# Patient Record
Sex: Female | Born: 1975 | Race: White | Hispanic: No | Marital: Married | Smoking: Never smoker
Health system: Southern US, Community
[De-identification: ages and names within clinical notes are randomized; demographics above are authoritative.]

## PROBLEM LIST (undated history)

## (undated) DIAGNOSIS — Z9889 Other specified postprocedural states: Secondary | ICD-10-CM

## (undated) DIAGNOSIS — I10 Essential (primary) hypertension: Secondary | ICD-10-CM

## (undated) DIAGNOSIS — E119 Type 2 diabetes mellitus without complications: Secondary | ICD-10-CM

## (undated) DIAGNOSIS — E7800 Pure hypercholesterolemia, unspecified: Secondary | ICD-10-CM

## (undated) HISTORY — PX: APPENDECTOMY: SHX54

---

## 2000-08-08 ENCOUNTER — Other Ambulatory Visit: Admission: RE | Admit: 2000-08-08 | Discharge: 2000-08-08 | Payer: Self-pay | Admitting: Obstetrics & Gynecology

## 2001-08-21 ENCOUNTER — Other Ambulatory Visit: Admission: RE | Admit: 2001-08-21 | Discharge: 2001-08-21 | Payer: Self-pay | Admitting: Obstetrics & Gynecology

## 2002-02-06 ENCOUNTER — Other Ambulatory Visit: Admission: RE | Admit: 2002-02-06 | Discharge: 2002-02-06 | Payer: Self-pay | Admitting: Obstetrics & Gynecology

## 2002-09-21 ENCOUNTER — Other Ambulatory Visit: Admission: RE | Admit: 2002-09-21 | Discharge: 2002-09-21 | Payer: Self-pay | Admitting: Obstetrics & Gynecology

## 2003-10-21 ENCOUNTER — Other Ambulatory Visit: Admission: RE | Admit: 2003-10-21 | Discharge: 2003-10-21 | Payer: Self-pay | Admitting: Obstetrics & Gynecology

## 2004-10-14 ENCOUNTER — Other Ambulatory Visit: Admission: RE | Admit: 2004-10-14 | Discharge: 2004-10-14 | Payer: Self-pay | Admitting: Obstetrics and Gynecology

## 2006-02-02 ENCOUNTER — Encounter: Admission: RE | Admit: 2006-02-02 | Discharge: 2006-05-03 | Payer: Self-pay | Admitting: Family Medicine

## 2006-03-09 ENCOUNTER — Encounter: Admission: RE | Admit: 2006-03-09 | Discharge: 2006-06-07 | Payer: Self-pay | Admitting: Family Medicine

## 2006-12-13 ENCOUNTER — Encounter: Admission: RE | Admit: 2006-12-13 | Discharge: 2006-12-29 | Payer: Self-pay | Admitting: Family Medicine

## 2007-02-11 ENCOUNTER — Inpatient Hospital Stay (HOSPITAL_COMMUNITY): Admission: AD | Admit: 2007-02-11 | Discharge: 2007-02-13 | Payer: Self-pay | Admitting: Obstetrics & Gynecology

## 2011-02-25 LAB — CBC
HCT: 36.2
HCT: 38
Hemoglobin: 12.5
Hemoglobin: 13.2
MCV: 86.8
MCV: 87
RBC: 4.36
RDW: 15.4 — ABNORMAL HIGH
WBC: 10.3

## 2011-02-25 LAB — RPR: RPR Ser Ql: NONREACTIVE

## 2015-08-25 ENCOUNTER — Encounter (HOSPITAL_BASED_OUTPATIENT_CLINIC_OR_DEPARTMENT_OTHER): Payer: Self-pay | Admitting: *Deleted

## 2015-08-25 ENCOUNTER — Emergency Department (HOSPITAL_BASED_OUTPATIENT_CLINIC_OR_DEPARTMENT_OTHER)
Admission: EM | Admit: 2015-08-25 | Discharge: 2015-08-25 | Disposition: A | Discharge status: Home / Self Care | Payer: BLUE CROSS/BLUE SHIELD | Attending: Emergency Medicine | Admitting: Emergency Medicine

## 2015-08-25 ENCOUNTER — Emergency Department (HOSPITAL_BASED_OUTPATIENT_CLINIC_OR_DEPARTMENT_OTHER): Payer: BLUE CROSS/BLUE SHIELD

## 2015-08-25 DIAGNOSIS — Z9889 Other specified postprocedural states: Secondary | ICD-10-CM | POA: Insufficient documentation

## 2015-08-25 DIAGNOSIS — E119 Type 2 diabetes mellitus without complications: Secondary | ICD-10-CM | POA: Diagnosis not present

## 2015-08-25 DIAGNOSIS — Z7984 Long term (current) use of oral hypoglycemic drugs: Secondary | ICD-10-CM | POA: Insufficient documentation

## 2015-08-25 DIAGNOSIS — R109 Unspecified abdominal pain: Secondary | ICD-10-CM

## 2015-08-25 DIAGNOSIS — M549 Dorsalgia, unspecified: Secondary | ICD-10-CM

## 2015-08-25 DIAGNOSIS — IMO0001 Reserved for inherently not codable concepts without codable children: Secondary | ICD-10-CM

## 2015-08-25 DIAGNOSIS — K2970 Gastritis, unspecified, without bleeding: Secondary | ICD-10-CM

## 2015-08-25 DIAGNOSIS — I10 Essential (primary) hypertension: Secondary | ICD-10-CM | POA: Diagnosis not present

## 2015-08-25 DIAGNOSIS — M62830 Muscle spasm of back: Secondary | ICD-10-CM | POA: Diagnosis not present

## 2015-08-25 DIAGNOSIS — Z3202 Encounter for pregnancy test, result negative: Secondary | ICD-10-CM | POA: Diagnosis not present

## 2015-08-25 DIAGNOSIS — R1013 Epigastric pain: Secondary | ICD-10-CM | POA: Diagnosis present

## 2015-08-25 HISTORY — DX: Essential (primary) hypertension: I10

## 2015-08-25 HISTORY — DX: Type 2 diabetes mellitus without complications: E11.9

## 2015-08-25 HISTORY — DX: Pure hypercholesterolemia, unspecified: E78.00

## 2015-08-25 LAB — CBC WITH DIFFERENTIAL/PLATELET
BASOS ABS: 0 10*3/uL (ref 0.0–0.1)
BASOS PCT: 1 %
EOS PCT: 2 %
Eosinophils Absolute: 0.1 10*3/uL (ref 0.0–0.7)
HCT: 32.7 % — ABNORMAL LOW (ref 36.0–46.0)
Hemoglobin: 10.6 g/dL — ABNORMAL LOW (ref 12.0–15.0)
LYMPHS PCT: 40 %
Lymphs Abs: 2.5 10*3/uL (ref 0.7–4.0)
MCH: 28 pg (ref 26.0–34.0)
MCHC: 32.4 g/dL (ref 30.0–36.0)
MCV: 86.5 fL (ref 78.0–100.0)
MONO ABS: 0.7 10*3/uL (ref 0.1–1.0)
MONOS PCT: 10 %
NEUTROS ABS: 3 10*3/uL (ref 1.7–7.7)
NEUTROS PCT: 47 %
PLATELETS: 277 10*3/uL (ref 150–400)
RBC: 3.78 MIL/uL — ABNORMAL LOW (ref 3.87–5.11)
RDW: 12.7 % (ref 11.5–15.5)
WBC: 6.4 10*3/uL (ref 4.0–10.5)

## 2015-08-25 LAB — COMPREHENSIVE METABOLIC PANEL
ALT: 28 U/L (ref 14–54)
ANION GAP: 9 (ref 5–15)
AST: 21 U/L (ref 15–41)
Albumin: 3.5 g/dL (ref 3.5–5.0)
Alkaline Phosphatase: 52 U/L (ref 38–126)
BILIRUBIN TOTAL: 0.2 mg/dL — AB (ref 0.3–1.2)
BUN: 16 mg/dL (ref 6–20)
CO2: 24 mmol/L (ref 22–32)
Calcium: 9.1 mg/dL (ref 8.9–10.3)
Chloride: 106 mmol/L (ref 101–111)
Creatinine, Ser: 0.89 mg/dL (ref 0.44–1.00)
GFR calc Af Amer: >60 mL/min (ref >60)
GLUCOSE: 167 mg/dL — AB (ref 65–99)
POTASSIUM: 4.2 mmol/L (ref 3.5–5.1)
Sodium: 139 mmol/L (ref 135–145)
TOTAL PROTEIN: 6.9 g/dL (ref 6.5–8.1)

## 2015-08-25 LAB — URINALYSIS, ROUTINE W REFLEX MICROSCOPIC
Bilirubin Urine: NEGATIVE
Glucose, UA: NEGATIVE mg/dL
Ketones, ur: 15 mg/dL — AB
NITRITE: NEGATIVE
Protein, ur: NEGATIVE mg/dL
SPECIFIC GRAVITY, URINE: 1.025 (ref 1.005–1.030)
pH: 5.5 (ref 5.0–8.0)

## 2015-08-25 LAB — URINE MICROSCOPIC-ADD ON

## 2015-08-25 LAB — TROPONIN I: Troponin I: <0.03 ng/mL (ref <0.031)

## 2015-08-25 LAB — PREGNANCY, URINE: PREG TEST UR: NEGATIVE

## 2015-08-25 LAB — LIPASE, BLOOD: LIPASE: 40 U/L (ref 11–51)

## 2015-08-25 LAB — D-DIMER, QUANTITATIVE (NOT AT ARMC): D DIMER QUANT: 0.58 ug{FEU}/mL — AB (ref 0.00–0.50)

## 2015-08-25 MED ORDER — METHOCARBAMOL 500 MG PO TABS: 500.0000 mg | ORAL_TABLET | Freq: Two times a day (BID) | ORAL | Status: DC

## 2015-08-25 MED ORDER — DICYCLOMINE HCL 20 MG PO TABS: 20.0000 mg | ORAL_TABLET | Freq: Two times a day (BID) | ORAL | Status: DC

## 2015-08-25 MED ORDER — KETOROLAC TROMETHAMINE 30 MG/ML IJ SOLN
30.0000 mg | Freq: Once | INTRAMUSCULAR | Status: AC
  Administered 2015-08-25: 30 mg via INTRAVENOUS
  Filled 2015-08-25: qty 1

## 2015-08-25 MED ORDER — IOPAMIDOL (ISOVUE-370) INJECTION 76%
100.0000 mL | Freq: Once | INTRAVENOUS | Status: AC | PRN
  Administered 2015-08-25: 100 mL via INTRAVENOUS

## 2015-08-25 MED ORDER — OMEPRAZOLE 20 MG PO CPDR: 20.0000 mg | DELAYED_RELEASE_CAPSULE | Freq: Every day | ORAL | Status: DC

## 2015-08-25 MED ORDER — SUCRALFATE 1 GM/10ML PO SUSP: 1.0000 g | Freq: Three times a day (TID) | ORAL | Status: DC

## 2015-08-25 MED ORDER — FENTANYL CITRATE (PF) 100 MCG/2ML IJ SOLN
50.0000 ug | Freq: Once | INTRAMUSCULAR | Status: AC
  Administered 2015-08-25: 50 ug via INTRAVENOUS
  Filled 2015-08-25: qty 2

## 2015-08-25 MED ORDER — FAMOTIDINE 20 MG PO TABS
20.0000 mg | ORAL_TABLET | Freq: Once | ORAL | Status: AC
  Administered 2015-08-25: 20 mg via ORAL
  Filled 2015-08-25: qty 1

## 2015-08-25 MED ORDER — TRAMADOL HCL 50 MG PO TABS: 50.0000 mg | ORAL_TABLET | Freq: Four times a day (QID) | ORAL | Status: DC | PRN

## 2015-08-25 MED ORDER — METHOCARBAMOL 500 MG PO TABS
1000.0000 mg | ORAL_TABLET | Freq: Once | ORAL | Status: AC
  Administered 2015-08-25: 1000 mg via ORAL
  Filled 2015-08-25: qty 2

## 2015-08-25 MED ORDER — GI COCKTAIL ~~LOC~~
30.0000 mL | Freq: Once | ORAL | Status: AC
  Administered 2015-08-25: 30 mL via ORAL
  Filled 2015-08-25: qty 30

## 2015-08-25 MED ORDER — DICYCLOMINE HCL 10 MG/ML IM SOLN
20.0000 mg | Freq: Once | INTRAMUSCULAR | Status: AC
  Administered 2015-08-25: 20 mg via INTRAMUSCULAR
  Filled 2015-08-25: qty 2

## 2015-08-25 NOTE — ED Provider Notes (Signed)
CSN: [id number]     Arrival date & time 08/25/15  0231 History   First MD Initiated Contact with Patient 08/25/15 [phone]     Chief Complaint  Patient presents with  . severe back pain      (Consider location/radiation/quality/duration/timing/severity/associated sxs/prior Treatment) Patient is a 40 y.o. female presenting with abdominal pain. The history is provided by the patient. No language interpreter was used.  Abdominal Pain Pain location:  Epigastric Pain quality: no stiffness and not tearing   Pain radiates to:  Does not radiate (but does have upper left back pain that started when she sat up acutely in bed due to pain in the epigastrum) Pain severity:  Severe Onset quality:  Sudden Timing:  Constant Progression:  Unchanged Chronicity:  New Context: awakening from sleep   Context: not diet changes, not sick contacts and not trauma   Relieved by:  Nothing Worsened by:  Nothing tried Ineffective treatments:  None tried Associated symptoms: belching   Associated symptoms: no chest pain, no chills, no constipation, no cough, no diarrhea, no dysuria, no fever, no hematuria, no shortness of breath and no vomiting   Risk factors: no alcohol abuse     Past Medical History  Diagnosis Date  . Diabetes mellitus without complication (HCC)   . Hypertension   . High cholesterol    Past Surgical History  Procedure Laterality Date  . Appendectomy     No family history on file. Social History  Substance Use Topics  . Smoking status: Never Smoker   . Smokeless tobacco: None  . Alcohol Use: Yes     Comment: very occasional    OB History    No data available     Review of Systems  Constitutional: Negative for fever and chills.  Respiratory: Negative for cough and shortness of breath.   Cardiovascular: Negative for chest pain, palpitations and leg swelling.  Gastrointestinal: Positive for abdominal pain. Negative for vomiting, diarrhea and constipation.  Genitourinary:  Negative for dysuria, hematuria and flank pain.  All other systems reviewed and are negative.     Allergies  Review of patient's allergies indicates no known allergies.  Home Medications   Prior to Admission medications   Medication Sig Start Date End Date Taking? Authorizing Provider  LISINOPRIL-HYDROCHLOROTHIAZIDE PO Take by mouth.   Yes Historical Provider, MD  metformin (FORTAMET) 1000 MG (OSM) 24 hr tablet Take 1,000 mg by mouth 2 (two) times daily with a meal.   Yes Historical Provider, MD   BP 170/94 mmHg  Pulse 97  Temp(Src) 97.6 F (36.4 C) (Oral)  Resp 22  Ht 5\' 4"  (1.626 m)  Wt 195 lb (88.451 kg)  BMI 33.46 kg/m2  SpO2 100%  LMP 08/18/2015 (Approximate) Physical Exam  Constitutional: She is oriented to person, place, and time. She appears well-developed and well-nourished. No distress.  HENT:  Head: Normocephalic and atraumatic.  Mouth/Throat: Oropharynx is clear and moist.  Eyes: Conjunctivae are normal. Pupils are equal, round, and reactive to light.  Neck: Normal range of motion. Neck supple. No thyromegaly present.  Cardiovascular: Normal rate, regular rhythm and intact distal pulses.   Pulmonary/Chest: Effort normal. No accessory muscle usage. No respiratory distress. She has no decreased breath sounds. She has no wheezes. She has no rhonchi. She has no rales.    Abdominal: Soft. She exhibits no mass. There is no tenderness. There is no rigidity, no rebound, no guarding, no tenderness at McBurney's point and negative Murphy's sign.  Extremely gassy in  the upper abdomen, hyperactive in the lower abdomen  Musculoskeletal: Normal range of motion. She exhibits no edema or tenderness.  Lymphadenopathy:    She has no cervical adenopathy.  Neurological: She is alert and oriented to person, place, and time. She has normal reflexes.  Skin: Skin is warm and dry. She is not diaphoretic.  Psychiatric: She has a normal mood and affect.    ED Course  Procedures  (including critical care time) Results for orders placed or performed during the hospital encounter of 08/25/15  Urinalysis, Routine w reflex microscopic (not at [facility])  Result Value Ref Range   Color, Urine YELLOW YELLOW   APPearance CLOUDY (A) CLEAR   Specific Gravity, Urine 1.025 1.005 - 1.030   pH 5.5 5.0 - 8.0   Glucose, UA NEGATIVE NEGATIVE mg/dL   Hgb urine dipstick SMALL (A) NEGATIVE   Bilirubin Urine NEGATIVE NEGATIVE   Ketones, ur 15 (A) NEGATIVE mg/dL   Protein, ur NEGATIVE NEGATIVE mg/dL   Nitrite NEGATIVE NEGATIVE   Leukocytes, UA MODERATE (A) NEGATIVE  Pregnancy, urine  Result Value Ref Range   Preg Test, Ur NEGATIVE NEGATIVE  CBC with Differential/Platelet  Result Value Ref Range   WBC 6.4 4.0 - 10.5 K/uL   RBC 3.78 (L) 3.87 - 5.11 MIL/uL   Hemoglobin 10.6 (L) 12.0 - 15.0 g/dL   HCT 16.1 (L) 09.6 - 04.5 %   MCV 86.5 78.0 - 100.0 fL   MCH 28.0 26.0 - 34.0 pg   MCHC 32.4 30.0 - 36.0 g/dL   RDW 40.9 81.1 - 91.4 %   Platelets 277 150 - 400 K/uL   Neutrophils Relative % 47 %   Neutro Abs 3.0 1.7 - 7.7 K/uL   Lymphocytes Relative 40 %   Lymphs Abs 2.5 0.7 - 4.0 K/uL   Monocytes Relative 10 %   Monocytes Absolute 0.7 0.1 - 1.0 K/uL   Eosinophils Relative 2 %   Eosinophils Absolute 0.1 0.0 - 0.7 K/uL   Basophils Relative 1 %   Basophils Absolute 0.0 0.0 - 0.1 K/uL  Comprehensive metabolic panel  Result Value Ref Range   Sodium 139 135 - 145 mmol/L   Potassium 4.2 3.5 - 5.1 mmol/L   Chloride 106 101 - 111 mmol/L   CO2 24 22 - 32 mmol/L   Glucose, Bld 167 (H) 65 - 99 mg/dL   BUN 16 6 - 20 mg/dL   Creatinine, Ser 7.82 0.44 - 1.00 mg/dL   Calcium 9.1 8.9 - 95.6 mg/dL   Total Protein 6.9 6.5 - 8.1 g/dL   Albumin 3.5 3.5 - 5.0 g/dL   AST 21 15 - 41 U/L   ALT 28 14 - 54 U/L   Alkaline Phosphatase 52 38 - 126 U/L   Total Bilirubin 0.2 (L) 0.3 - 1.2 mg/dL   GFR calc non Af Amer >60 >60 mL/min   GFR calc Af Amer >60 >60 mL/min   Anion gap 9 5 - 15  Lipase, blood    Result Value Ref Range   Lipase 40 11 - 51 U/L  Troponin I  Result Value Ref Range   Troponin I <0.03 <0.031 ng/mL  Urine microscopic-add on  Result Value Ref Range   Squamous Epithelial / LPF 6-30 (A) NONE SEEN   WBC, UA 6-30 0 - 5 WBC/hpf   RBC / HPF 0-5 0 - 5 RBC/hpf   Bacteria, UA FEW (A) NONE SEEN   No results found.   Imaging Review No results  found. I have personally reviewed and evaluated these images and lab results as part of my medical decision-making.   EKG Interpretation None      Date: 08/25/2015  Rate: 78  Rhythm: normal sinus rhythm  QRS Axis: normal  Intervals: normal  ST/T Wave abnormalities: normal  Conduction Disutrbances: none  Narrative Interpretation: unremarkable    MDM   Final diagnoses:  None    Results for orders placed or performed during the hospital encounter of 08/25/15  Urinalysis, Routine w reflex microscopic (not at [facility])  Result Value Ref Range   Color, Urine YELLOW YELLOW   APPearance CLOUDY (A) CLEAR   Specific Gravity, Urine 1.025 1.005 - 1.030   pH 5.5 5.0 - 8.0   Glucose, UA NEGATIVE NEGATIVE mg/dL   Hgb urine dipstick SMALL (A) NEGATIVE   Bilirubin Urine NEGATIVE NEGATIVE   Ketones, ur 15 (A) NEGATIVE mg/dL   Protein, ur NEGATIVE NEGATIVE mg/dL   Nitrite NEGATIVE NEGATIVE   Leukocytes, UA MODERATE (A) NEGATIVE  Pregnancy, urine  Result Value Ref Range   Preg Test, Ur NEGATIVE NEGATIVE  CBC with Differential/Platelet  Result Value Ref Range   WBC 6.4 4.0 - 10.5 K/uL   RBC 3.78 (L) 3.87 - 5.11 MIL/uL   Hemoglobin 10.6 (L) 12.0 - 15.0 g/dL   HCT 41.6 (L) 60.6 - 30.1 %   MCV 86.5 78.0 - 100.0 fL   MCH 28.0 26.0 - 34.0 pg   MCHC 32.4 30.0 - 36.0 g/dL   RDW 60.1 09.3 - 23.5 %   Platelets 277 150 - 400 K/uL   Neutrophils Relative % 47 %   Neutro Abs 3.0 1.7 - 7.7 K/uL   Lymphocytes Relative 40 %   Lymphs Abs 2.5 0.7 - 4.0 K/uL   Monocytes Relative 10 %   Monocytes Absolute 0.7 0.1 - 1.0 K/uL   Eosinophils  Relative 2 %   Eosinophils Absolute 0.1 0.0 - 0.7 K/uL   Basophils Relative 1 %   Basophils Absolute 0.0 0.0 - 0.1 K/uL  Comprehensive metabolic panel  Result Value Ref Range   Sodium 139 135 - 145 mmol/L   Potassium 4.2 3.5 - 5.1 mmol/L   Chloride 106 101 - 111 mmol/L   CO2 24 22 - 32 mmol/L   Glucose, Bld 167 (H) 65 - 99 mg/dL   BUN 16 6 - 20 mg/dL   Creatinine, Ser 5.73 0.44 - 1.00 mg/dL   Calcium 9.1 8.9 - 22.0 mg/dL   Total Protein 6.9 6.5 - 8.1 g/dL   Albumin 3.5 3.5 - 5.0 g/dL   AST 21 15 - 41 U/L   ALT 28 14 - 54 U/L   Alkaline Phosphatase 52 38 - 126 U/L   Total Bilirubin 0.2 (L) 0.3 - 1.2 mg/dL   GFR calc non Af Amer >60 >60 mL/min   GFR calc Af Amer >60 >60 mL/min   Anion gap 9 5 - 15  Lipase, blood  Result Value Ref Range   Lipase 40 11 - 51 U/L  Troponin I  Result Value Ref Range   Troponin I <0.03 <0.031 ng/mL  D-dimer, quantitative (not at [facility])  Result Value Ref Range   D-Dimer, Quant 0.58 (H) 0.00 - 0.50 ug/mL-FEU  Urine microscopic-add on  Result Value Ref Range   Squamous Epithelial / LPF 6-30 (A) NONE SEEN   WBC, UA 6-30 0 - 5 WBC/hpf   RBC / HPF 0-5 0 - 5 RBC/hpf   Bacteria, UA FEW (A)  NONE SEEN   Ct Angio Chest/abd/pel For Dissection W And/or W/wo  08/25/2015  CLINICAL DATA:  Awakened at 01:15 by severe pain in her upper back. Vomiting. Upper abdominal pain, diaphoresis and dyspnea as well. EXAM: CT ANGIOGRAPHY CHEST, ABDOMEN AND PELVIS TECHNIQUE: Multidetector CT imaging through the chest, abdomen and pelvis was performed using the standard protocol during bolus administration of intravenous contrast. Multiplanar reconstructed images and MIPs were obtained and reviewed to evaluate the vascular anatomy. CONTRAST:  100 mL Isovue 370 intravenous COMPARISON:  None. FINDINGS: CTA CHEST FINDINGS The thoracic aorta is normal in caliber and intact. There is no dissection. There is no aneurysm. Great vessels are widely patent proximally. Review of the MIP images  confirms the above findings. Nonvascular findings in the chest: No adenopathy. No effusions. The airways are patent. The lungs are clear. No significant skeletal lesion. CTA ABDOMEN AND PELVIS FINDINGS The abdominal aorta is normal in caliber. The celiac, SMA and IMA arteries are widely patent. Both single renal arteries are widely patent. Iliac arteries are widely patent. Review of the MIP images confirms the above findings. Nonvascular findings in the abdomen and pelvis: The liver, gallbladder and bile ducts are unremarkable. The pancreas, spleen, adrenals and kidneys appear normal. Ureters and urinary bladder are unremarkable. Probable uterine fibroids. No adnexal abnormalities. Bowel is unremarkable. No acute inflammatory changes are evident in the abdomen or pelvis. There is no adenopathy. There is no ascites. No significant skeletal lesion is evident. IMPRESSION: 1. The thoracic and abdominal aorta is normal in caliber and intact. No dissection or other acute vascular event. 2. No significant abnormality in the chest. 3. Probable uterine fibroids. Otherwise unremarkable abdomen and pelvis. Electronically Signed   By: Ellery Plunkaniel R Mitchell M.D.   On: 08/25/2015 05:46   I suspect this is gastritis with really bad gas as patient had really bad gas on exam and felt some better post GI cocktail.  She has been ruled out for MI, dissection had normal appendix and internal organs there is not exam or lab finding consistent with gall bladder disease and the gall bladder was read as unremarkable on CT.  PERC negative wells 0 highly doubt PE.  Ruled out for MI with normal EKG and 2 normal troponins. No dissection.   Exam is consistent with gastritis likely GERd/gas related to eating chips and ribs before going to bed.  Back pain is a spasm likely from the way patient sat bolt upright in bed due to the pain.  Will treat symptomatically robaxin.  Recheck at your doctor in 48 hours.  Strict abdominal pain return precautions  given to the patient.  Verbalizes understanding and agrees to follow up   An After Visit Summary was printed and given to the patient.   Medication List    TAKE these medications        dicyclomine 20 MG tablet  Commonly known as:  BENTYL  Take 1 tablet (20 mg total) by mouth 2 (two) times daily.     methocarbamol 500 MG tablet  Commonly known as:  ROBAXIN  Take 1 tablet (500 mg total) by mouth 2 (two) times daily.     omeprazole 20 MG capsule  Commonly known as:  PRILOSEC  Take 1 capsule (20 mg total) by mouth daily.     sucralfate 1 GM/10ML suspension  Commonly known as:  CARAFATE  Take 10 mLs (1 g total) by mouth 4 (four) times daily -  with meals and at bedtime.  traMADol 50 MG tablet  Commonly known as:  ULTRAM  Take 1 tablet (50 mg total) by mouth every 6 (six) hours as needed.      ASK your doctor about these medications        LISINOPRIL-HYDROCHLOROTHIAZIDE PO  Take by mouth.     metformin 1000 MG (OSM) 24 hr tablet  Commonly known as:  FORTAMET  Take 1,000 mg by mouth 2 (two) times daily with a meal.         Kiet Geer, MD 08/25/15 0725

## 2015-08-25 NOTE — ED Notes (Signed)
Returned from CT.

## 2015-08-25 NOTE — ED Notes (Addendum)
Pt states she woke up at 1:15am to severe pain in her upper back. States pain is constant and feels tight. States she has had several bowel movements. States she vomited times one. States she is also having upper abd pain bilateral. C/o feeling a little sob and diaphoretic. C/o nausea. Denies any issues with her gallbladder. States she has been sick a few weeks ago and was on a couple antibiotics. States she has been fatigued since then.

## 2015-08-25 NOTE — ED Notes (Signed)
Pt in CT.

## 2015-08-25 NOTE — ED Notes (Signed)
Ginger ale given per pt request. 

## 2015-08-25 NOTE — ED Notes (Signed)
MD with pt  

## 2015-08-26 ENCOUNTER — Emergency Department (HOSPITAL_COMMUNITY): Payer: BLUE CROSS/BLUE SHIELD

## 2015-08-26 ENCOUNTER — Encounter (HOSPITAL_COMMUNITY): Payer: Self-pay | Admitting: *Deleted

## 2015-08-26 ENCOUNTER — Emergency Department (HOSPITAL_COMMUNITY)
Admission: EM | Admit: 2015-08-26 | Discharge: 2015-08-26 | Disposition: A | Discharge status: Home / Self Care | Payer: BLUE CROSS/BLUE SHIELD | Attending: Emergency Medicine | Admitting: Emergency Medicine

## 2015-08-26 DIAGNOSIS — R109 Unspecified abdominal pain: Secondary | ICD-10-CM | POA: Diagnosis present

## 2015-08-26 DIAGNOSIS — Z3202 Encounter for pregnancy test, result negative: Secondary | ICD-10-CM | POA: Insufficient documentation

## 2015-08-26 DIAGNOSIS — R630 Anorexia: Secondary | ICD-10-CM | POA: Diagnosis not present

## 2015-08-26 DIAGNOSIS — Z7984 Long term (current) use of oral hypoglycemic drugs: Secondary | ICD-10-CM | POA: Diagnosis not present

## 2015-08-26 DIAGNOSIS — D649 Anemia, unspecified: Secondary | ICD-10-CM | POA: Diagnosis not present

## 2015-08-26 DIAGNOSIS — Z79899 Other long term (current) drug therapy: Secondary | ICD-10-CM | POA: Diagnosis not present

## 2015-08-26 DIAGNOSIS — R51 Headache: Secondary | ICD-10-CM | POA: Insufficient documentation

## 2015-08-26 DIAGNOSIS — R110 Nausea: Secondary | ICD-10-CM | POA: Insufficient documentation

## 2015-08-26 DIAGNOSIS — E119 Type 2 diabetes mellitus without complications: Secondary | ICD-10-CM | POA: Diagnosis not present

## 2015-08-26 DIAGNOSIS — R1010 Upper abdominal pain, unspecified: Secondary | ICD-10-CM

## 2015-08-26 DIAGNOSIS — R1011 Right upper quadrant pain: Secondary | ICD-10-CM | POA: Diagnosis not present

## 2015-08-26 DIAGNOSIS — I10 Essential (primary) hypertension: Secondary | ICD-10-CM | POA: Diagnosis not present

## 2015-08-26 LAB — I-STAT BETA HCG BLOOD, ED (MC, WL, AP ONLY): I-stat hCG, quantitative: <5 m[IU]/mL (ref <5)

## 2015-08-26 LAB — CBC
HEMATOCRIT: 31.3 % — AB (ref 36.0–46.0)
HEMOGLOBIN: 10.5 g/dL — AB (ref 12.0–15.0)
MCH: 28.3 pg (ref 26.0–34.0)
MCHC: 33.5 g/dL (ref 30.0–36.0)
MCV: 84.4 fL (ref 78.0–100.0)
Platelets: 309 10*3/uL (ref 150–400)
RBC: 3.71 MIL/uL — ABNORMAL LOW (ref 3.87–5.11)
RDW: 13 % (ref 11.5–15.5)
WBC: 6.6 10*3/uL (ref 4.0–10.5)

## 2015-08-26 LAB — COMPREHENSIVE METABOLIC PANEL
ALBUMIN: 3.3 g/dL — AB (ref 3.5–5.0)
ALT: 27 U/L (ref 14–54)
ANION GAP: 12 (ref 5–15)
AST: 19 U/L (ref 15–41)
Alkaline Phosphatase: 40 U/L (ref 38–126)
BILIRUBIN TOTAL: 0.3 mg/dL (ref 0.3–1.2)
BUN: 11 mg/dL (ref 6–20)
CO2: 21 mmol/L — AB (ref 22–32)
Calcium: 9.2 mg/dL (ref 8.9–10.3)
Chloride: 108 mmol/L (ref 101–111)
Creatinine, Ser: 0.8 mg/dL (ref 0.44–1.00)
GFR calc Af Amer: >60 mL/min (ref >60)
GFR calc non Af Amer: >60 mL/min (ref >60)
GLUCOSE: 152 mg/dL — AB (ref 65–99)
POTASSIUM: 4.2 mmol/L (ref 3.5–5.1)
SODIUM: 141 mmol/L (ref 135–145)
TOTAL PROTEIN: 6.6 g/dL (ref 6.5–8.1)

## 2015-08-26 LAB — LIPASE, BLOOD: Lipase: 48 U/L (ref 11–51)

## 2015-08-26 LAB — POC OCCULT BLOOD, ED: Fecal Occult Bld: NEGATIVE

## 2015-08-26 MED ORDER — ACETAMINOPHEN 325 MG PO TABS
650.0000 mg | ORAL_TABLET | Freq: Once | ORAL | Status: AC
  Administered 2015-08-26: 650 mg via ORAL
  Filled 2015-08-26: qty 2

## 2015-08-26 MED ORDER — PANTOPRAZOLE SODIUM 40 MG IV SOLR
40.0000 mg | Freq: Once | INTRAVENOUS | Status: AC
  Administered 2015-08-26: 40 mg via INTRAVENOUS
  Filled 2015-08-26: qty 40

## 2015-08-26 NOTE — ED Notes (Signed)
Patient transported to Ultrasound 

## 2015-08-26 NOTE — ED Notes (Signed)
Phlebotomy at the bedside  

## 2015-08-26 NOTE — ED Notes (Signed)
Pt c/o bil upper abd pain onset x 2 days, pt seen at [facility] x 2 days for mid & upper back pain with bil upper abd pain, pt told it was related to gas, pt denies relief of pain at this time, pt denies n/v/d, A&O x4

## 2015-08-26 NOTE — ED Notes (Signed)
PA at the bedside.

## 2015-08-26 NOTE — ED Provider Notes (Signed)
CSN: [id number]     Arrival date & time 08/26/15  1015 History   First MD Initiated Contact with Patient 08/26/15 1227     Chief Complaint  Patient presents with  . Abdominal Pain     (Consider location/radiation/quality/duration/timing/severity/associated sxs/prior Treatment) HPI Comments: Patient is a 40 y/o female who presents to the ED with abdominal pain that is intermittent, described as contractions, wakes her at night, occurs daily around each meal time with or without food and in the middle of the night, does not radiate, has been occuring over the past few weeks but has gotten worse to where it brought her into the ED yesterday. She states she has mild bloating and belching with food. Food doesn't affect the pain. She has not eaten anything since last evening. She was given a GI cocktail in the ED yesterday which helped a little but the pain returned. Patient states she was sick roughly 3 weeks ago with a possible sinus infection and was given a Z pack which helped. Husband states she has been sleeping more than normal since she was sick. She denies frequent NSAID use and states she uses ETOH only occasionally.  ED course: Patient states she is not in pain at the present time and does not need anything for her abdominal pain current. She has asked for something for her headache.  Patient is a 40 y.o. female presenting with abdominal pain. The history is provided by the patient and the spouse.  Abdominal Pain Associated symptoms: nausea   Associated symptoms: no chest pain, no chills, no constipation, no cough, no diarrhea, no dysuria, no fever, no shortness of breath, no sore throat and no vomiting     Past Medical History  Diagnosis Date  . Diabetes mellitus without complication (HCC)   . Hypertension   . High cholesterol    Past Surgical History  Procedure Laterality Date  . Appendectomy     No family history on file. Social History  Substance Use Topics  . Smoking  status: Never Smoker   . Smokeless tobacco: None  . Alcohol Use: Yes     Comment: very occasional    OB History    No data available     Review of Systems  Constitutional: Negative for fever, chills and appetite change.  HENT: Negative for ear pain, hearing loss and sore throat.   Eyes: Negative for visual disturbance.  Respiratory: Negative for cough, chest tightness and shortness of breath.   Cardiovascular: Negative for chest pain, palpitations and leg swelling.  Gastrointestinal: Positive for nausea and abdominal pain. Negative for vomiting, diarrhea, constipation and blood in stool.       Positive for dry heaves   Genitourinary: Negative for dysuria and difficulty urinating.       Positive for "strong" smelling urine  Musculoskeletal: Negative for myalgias, joint swelling and arthralgias.  Skin: Negative for rash.  Neurological: Positive for headaches. Negative for dizziness, syncope and light-headedness.  Psychiatric/Behavioral: Negative for confusion and agitation.      Allergies  Review of patient's allergies indicates no known allergies.  Home Medications   Prior to Admission medications   Medication Sig Start Date End Date Taking? Authorizing Provider  dicyclomine (BENTYL) 20 MG tablet Take 1 tablet (20 mg total) by mouth 2 (two) times daily. 08/25/15   April Palumbo, MD  LISINOPRIL-HYDROCHLOROTHIAZIDE PO Take by mouth.    Historical Provider, MD  metformin (FORTAMET) 1000 MG (OSM) 24 hr tablet Take 1,000 mg by mouth 2 (  two) times daily with a meal.    Historical Provider, MD  methocarbamol (ROBAXIN) 500 MG tablet Take 1 tablet (500 mg total) by mouth 2 (two) times daily. 08/25/15   April Palumbo, MD  omeprazole (PRILOSEC) 20 MG capsule Take 1 capsule (20 mg total) by mouth daily. 08/25/15   April Palumbo, MD  sucralfate (CARAFATE) 1 GM/10ML suspension Take 10 mLs (1 g total) by mouth 4 (four) times daily -  with meals and at bedtime. 08/25/15   April Palumbo, MD   traMADol (ULTRAM) 50 MG tablet Take 1 tablet (50 mg total) by mouth every 6 (six) hours as needed. 08/25/15   April Palumbo, MD   BP 154/90 mmHg  Pulse 77  Temp(Src) 98.4 F (36.9 C) (Oral)  Resp 20  Ht 5\' 4"  (1.626 m)  Wt 88.451 kg  BMI 33.46 kg/m2  SpO2 100%  LMP 08/18/2015 Physical Exam  Constitutional: She is oriented to person, place, and time. She appears well-developed and well-nourished. No distress.  HENT:  Head: Normocephalic and atraumatic.  Eyes: Conjunctivae are normal.  Neck: Normal range of motion.  Cardiovascular: Normal rate, regular rhythm and normal heart sounds.   Pulmonary/Chest: Effort normal and breath sounds normal.  Abdominal: Soft. Bowel sounds are normal. She exhibits no distension and no mass. There is no rebound and no guarding.  Patient has mild TTP of the RUQ around the 12th rib  Musculoskeletal: Normal range of motion. She exhibits no edema.  Neurological: She is alert and oriented to person, place, and time. Coordination normal.  Skin: Skin is warm and dry. No rash noted.  Psychiatric: She has a normal mood and affect. Her behavior is normal.    ED Course  Procedures (including critical care time) Labs Review Labs Reviewed  COMPREHENSIVE METABOLIC PANEL - Abnormal; Notable for the following:    CO2 21 (*)    Glucose, Bld 152 (*)    Albumin 3.3 (*)    All other components within normal limits  CBC - Abnormal; Notable for the following:    RBC 3.71 (*)    Hemoglobin 10.5 (*)    HCT 31.3 (*)    All other components within normal limits  LIPASE, BLOOD  URINALYSIS, ROUTINE W REFLEX MICROSCOPIC (NOT AT [facility])  I-STAT BETA HCG BLOOD, ED ([facility], [facility], [facility] ONLY)  POC OCCULT BLOOD, ED    Imaging Review Ct Angio Chest/abd/pel For Dissection W And/or W/wo  08/25/2015  CLINICAL DATA:  Awakened at 01:15 by severe pain in her upper back. Vomiting. Upper abdominal pain, diaphoresis and dyspnea as well. EXAM: CT ANGIOGRAPHY CHEST, ABDOMEN AND PELVIS  TECHNIQUE: Multidetector CT imaging through the chest, abdomen and pelvis was performed using the standard protocol during bolus administration of intravenous contrast. Multiplanar reconstructed images and MIPs were obtained and reviewed to evaluate the vascular anatomy. CONTRAST:  100 mL Isovue 370 intravenous COMPARISON:  None. FINDINGS: CTA CHEST FINDINGS The thoracic aorta is normal in caliber and intact. There is no dissection. There is no aneurysm. Great vessels are widely patent proximally. Review of the MIP images confirms the above findings. Nonvascular findings in the chest: No adenopathy. No effusions. The airways are patent. The lungs are clear. No significant skeletal lesion. CTA ABDOMEN AND PELVIS FINDINGS The abdominal aorta is normal in caliber. The celiac, SMA and IMA arteries are widely patent. Both single renal arteries are widely patent. Iliac arteries are widely patent. Review of the MIP images confirms the above findings. Nonvascular findings in the abdomen  and pelvis: The liver, gallbladder and bile ducts are unremarkable. The pancreas, spleen, adrenals and kidneys appear normal. Ureters and urinary bladder are unremarkable. Probable uterine fibroids. No adnexal abnormalities. Bowel is unremarkable. No acute inflammatory changes are evident in the abdomen or pelvis. There is no adenopathy. There is no ascites. No significant skeletal lesion is evident. IMPRESSION: 1. The thoracic and abdominal aorta is normal in caliber and intact. No dissection or other acute vascular event. 2. No significant abnormality in the chest. 3. Probable uterine fibroids. Otherwise unremarkable abdomen and pelvis. Electronically Signed   By: Ellery Plunk M.D.   On: 08/25/2015 05:46   I have personally reviewed and evaluated these images and lab results as part of my medical decision-making.   EKG Interpretation None      MDM   Final diagnoses:  None    Amy Sanders is a 40 y/o with abdominal  pain that has gotten worse over the past 2 days. She was in the ED in [facility] yesterday in which numerous labs and imaging studies were done. I reviewed the results of her CT yesterday which revealed no acute findings. Cardiac causes of her pain were evaluated yesterday and with no acute findings. Her lipase was 48, she was found to be anemic with a HGB of 10.5. Based on her history of pain and her mild anemia we collected a fecal occult which was negative.   We are suspecting peptic or duodenal ulcer disease, gastritis, or bilary colic. We got a US done today which revealed no acute findings suggestive of acute cholecystitis or dochocholecytisis.   This pain could be related to a dysfunctional gallbladder or peptic ulcer disease. Without the ability to order a HIDA scan or a EGD from the ED we are going to try to set up an appointment for her for tomorrow with GI.   1539: patient states she has had a few episodes of pain while in the ED, one during the [place] but she is currently not in pain and resting comfortably. We instructed her to take the medications she received in the ED yesterday. Dr. Hulen Shouts office will call her to give her a time to be seen on Thursday 4/13 and they are trying to set up a HIDA scan for tomorrow but no guarentee but they will also call her to let her know if they are able to.  I discussed all of the results with the patient and family members they have expressed their understanding to the verbal discharge instructions.         Jerre Simon, PA 08/26/15 1611  Jerelyn Scott, MD 08/27/15 [phone]

## 2015-08-26 NOTE — Discharge Instructions (Signed)
You were seen today for abdominal pain.   Dr. Hulen Shoutsutlaw's office will call you for an appointment for Thursday 4/13. They will also call you to let you know if they are able to schedule a HIDA scan for tomorrow.   Return to the ED if you have inretractable pain, fever, chills, nausea, vomiting, diarrhea, blood in your vomit or stools.   Abdominal Pain, Adult Many things can cause abdominal pain. Usually, abdominal pain is not caused by a disease and will improve without treatment. It can often be observed and treated at home. Your health care provider will do a physical exam and possibly order blood tests and X-rays to help determine the seriousness of your pain. However, in many cases, more time must pass before a clear cause of the pain can be found. Before that point, your health care provider may not know if you need more testing or further treatment. HOME CARE INSTRUCTIONS Monitor your abdominal pain for any changes. The following actions may help to alleviate any discomfort you are experiencing:  Only take over-the-counter or prescription medicines as directed by your health care provider.  Do not take laxatives unless directed to do so by your health care provider.  Try a clear liquid diet (broth, tea, or water) as directed by your health care provider. Slowly move to a bland diet as tolerated. SEEK MEDICAL CARE IF:  You have unexplained abdominal pain.  You have abdominal pain associated with nausea or diarrhea.  You have pain when you urinate or have a bowel movement.  You experience abdominal pain that wakes you in the night.  You have abdominal pain that is worsened or improved by eating food.  You have abdominal pain that is worsened with eating fatty foods.  You have a fever. SEEK IMMEDIATE MEDICAL CARE IF:  Your pain does not go away within 2 hours.  You keep throwing up (vomiting).  Your pain is felt only in portions of the abdomen, such as the right side or the left  lower portion of the abdomen.  You pass bloody or black tarry stools. MAKE SURE YOU:  Understand these instructions.  Will watch your condition.  Will get help right away if you are not doing well or get worse.   This information is not intended to replace advice given to you by your health care provider. Make sure you discuss any questions you have with your health care provider.   Document Released: 02/10/2005 Document Revised: 01/22/2015 Document Reviewed: 01/10/2013 Elsevier Interactive Patient Education [employer]2016 Elsevier Inc.

## 2015-08-26 NOTE — ED Provider Notes (Signed)
S: Amy Sanders is a 40 y.o. female presents to the ED with worsening intermittent right upper quadrant epigastric abdominal pain. Record review shows that patient was seen at [facility] 1 day ago for the same. At that time she had normal labs, negative CT angiogram of the chest and abdomen. She was discharged home with symptomatic therapy. Patient reports it has not significantly helped and she has only taken some of it. She reports the pain is spasmodic and feels like contractions in the right upper quadrant. Of note her gallbladder was normal on the CT scan however no ultrasound was done. Patient was also found to be anemic at that visit.  O:  General: Awake  HEENT: Atraumatic  Resp: Normal effort  Cardiac: RRR Abd: Nondistended, soft, nontender to palpation  Neuro:No focal weakness   Results for orders placed or performed during the hospital encounter of 08/26/15  Lipase, blood  Result Value Ref Range   Lipase 48 11 - 51 U/L  Comprehensive metabolic panel  Result Value Ref Range   Sodium 141 135 - 145 mmol/L   Potassium 4.2 3.5 - 5.1 mmol/L   Chloride 108 101 - 111 mmol/L   CO2 21 (L) 22 - 32 mmol/L   Glucose, Bld 152 (H) 65 - 99 mg/dL   BUN 11 6 - 20 mg/dL   Creatinine, Ser 0.45 0.44 - 1.00 mg/dL   Calcium 9.2 8.9 - 40.9 mg/dL   Total Protein 6.6 6.5 - 8.1 g/dL   Albumin 3.3 (L) 3.5 - 5.0 g/dL   AST 19 15 - 41 U/L   ALT 27 14 - 54 U/L   Alkaline Phosphatase 40 38 - 126 U/L   Total Bilirubin 0.3 0.3 - 1.2 mg/dL   GFR calc non Af Amer >60 >60 mL/min   GFR calc Af Amer >60 >60 mL/min   Anion gap 12 5 - 15  CBC  Result Value Ref Range   WBC 6.6 4.0 - 10.5 K/uL   RBC 3.71 (L) 3.87 - 5.11 MIL/uL   Hemoglobin 10.5 (L) 12.0 - 15.0 g/dL   HCT 81.1 (L) 91.4 - 78.2 %   MCV 84.4 78.0 - 100.0 fL   MCH 28.3 26.0 - 34.0 pg   MCHC 33.5 30.0 - 36.0 g/dL   RDW 95.6 21.3 - 08.6 %   Platelets 309 150 - 400 K/uL  I-Stat Beta hCG blood, ED ([facility], [facility], [facility] only)  Result Value Ref  Range   I-stat hCG, quantitative <5.0 <5 mIU/mL   Comment 3          POC occult blood, ED Provider will collect  Result Value Ref Range   Fecal Occult Bld NEGATIVE NEGATIVE   US Abdomen Complete  08/26/2015  CLINICAL DATA:  Right upper quadrant pain. EXAM: ABDOMEN ULTRASOUND COMPLETE COMPARISON:  None. FINDINGS: Gallbladder: No gallstones or wall thickening visualized. No sonographic Murphy sign noted by sonographer. Common bile duct: Diameter: 2.7 mm Liver: No focal lesion identified. Within normal limits in parenchymal echogenicity. IVC: No abnormality visualized. Pancreas: Visualized portion unremarkable. Spleen: Size and appearance within normal limits. Right Kidney: Length: 10.2 cm. Echogenicity within normal limits. No mass or hydronephrosis visualized. Left Kidney: Length: 11.1 cm. Echogenicity within normal limits. No mass or hydronephrosis visualized. Abdominal aorta: No aneurysm visualized. Other findings: None. IMPRESSION: Normal abdominal ultrasound. Electronically Signed   By: Elige Ko   On: 08/26/2015 15:00   Ct Angio Chest/abd/pel For Dissection W And/or W/wo 08/25/2015  IMPRESSION: 1. The thoracic and abdominal aorta is normal in caliber and intact. No dissection or other acute vascular event. 2. No significant abnormality in the chest. 3. Probable uterine fibroids. Otherwise unremarkable abdomen and pelvis. Electronically Signed   By: Ellery Plunkaniel R Mitchell M.D.   On: 08/25/2015 05:46     A/P:  Pt with right upper quadrant abdominal pain. Labs remain reassuring. Ultrasound shows normal gallbladder without gallstones or wall thickening. Concern for potential gallbladder dysfunction versus peptic ulcer disease.  Patient follow-up was arranged with Dr. Dulce Sellarutlaw, who will plan for HIDA scan and will reevaluate her on Thursday. Encouraged patient to take her medications as directed including her pain medication as needed. So discussed conservative diet choices.  Patient is well-appearing  and her pain is under control at this time.  BP 154/90 mmHg  Pulse 77  Temp(Src) 98.4 F (36.9 C) (Oral)  Resp 20  Ht 5\' 4"  (1.626 m)  Wt 88.451 kg  BMI 33.46 kg/m2  SpO2 100%  LMP 08/18/2015   Pt was seen by Mattie MarlinJessica Focht, PA-C and personally evaluated by myself and discussed with Jerelyn ScottMartha Linker, MD.      Dierdre ForthHannah Stephens Shreve, PA-C 08/26/15 1553  Jerelyn ScottMartha Linker, MD 08/26/15 1554

## 2015-08-27 ENCOUNTER — Encounter (HOSPITAL_COMMUNITY): Admission: RE | Admit: 2015-08-27 | Payer: BLUE CROSS/BLUE SHIELD | Source: Ambulatory Visit

## 2015-08-27 ENCOUNTER — Encounter (HOSPITAL_COMMUNITY): Payer: BLUE CROSS/BLUE SHIELD

## 2015-08-27 ENCOUNTER — Other Ambulatory Visit (HOSPITAL_COMMUNITY): Payer: Self-pay | Admitting: Gastroenterology

## 2015-08-27 ENCOUNTER — Ambulatory Visit (HOSPITAL_COMMUNITY)
Admission: RE | Admit: 2015-08-27 | Discharge: 2015-08-27 | Disposition: A | Discharge status: Home / Self Care | Payer: BLUE CROSS/BLUE SHIELD | Source: Ambulatory Visit | Attending: Gastroenterology | Admitting: Gastroenterology

## 2015-08-27 DIAGNOSIS — R1011 Right upper quadrant pain: Secondary | ICD-10-CM

## 2015-08-27 DIAGNOSIS — K828 Other specified diseases of gallbladder: Secondary | ICD-10-CM | POA: Diagnosis not present

## 2015-08-27 MED ORDER — TECHNETIUM TC 99M MEBROFENIN IV KIT
5.2000 | PACK | Freq: Once | INTRAVENOUS | Status: AC | PRN
  Administered 2015-08-27: 5.2 via INTRAVENOUS

## 2015-09-17 ENCOUNTER — Other Ambulatory Visit: Payer: Self-pay | Admitting: Surgery

## 2016-03-03 ENCOUNTER — Other Ambulatory Visit: Payer: Self-pay | Admitting: Surgery

## 2016-03-25 ENCOUNTER — Encounter (HOSPITAL_BASED_OUTPATIENT_CLINIC_OR_DEPARTMENT_OTHER): Payer: Self-pay | Admitting: *Deleted

## 2016-03-26 ENCOUNTER — Encounter (HOSPITAL_BASED_OUTPATIENT_CLINIC_OR_DEPARTMENT_OTHER)
Admission: RE | Admit: 2016-03-26 | Discharge: 2016-03-26 | Disposition: A | Discharge status: Home / Self Care | Payer: BLUE CROSS/BLUE SHIELD | Source: Ambulatory Visit | Attending: Surgery | Admitting: Surgery

## 2016-03-26 DIAGNOSIS — E7800 Pure hypercholesterolemia, unspecified: Secondary | ICD-10-CM | POA: Insufficient documentation

## 2016-03-26 DIAGNOSIS — Z01812 Encounter for preprocedural laboratory examination: Secondary | ICD-10-CM | POA: Insufficient documentation

## 2016-03-26 DIAGNOSIS — E119 Type 2 diabetes mellitus without complications: Secondary | ICD-10-CM | POA: Insufficient documentation

## 2016-03-26 DIAGNOSIS — I10 Essential (primary) hypertension: Secondary | ICD-10-CM | POA: Diagnosis not present

## 2016-03-26 LAB — BASIC METABOLIC PANEL
ANION GAP: 10 (ref 5–15)
BUN: 14 mg/dL (ref 6–20)
CALCIUM: 9.4 mg/dL (ref 8.9–10.3)
CO2: 24 mmol/L (ref 22–32)
Chloride: 102 mmol/L (ref 101–111)
Creatinine, Ser: 1.02 mg/dL — ABNORMAL HIGH (ref 0.44–1.00)
GFR calc non Af Amer: >60 mL/min (ref >60)
Glucose, Bld: 200 mg/dL — ABNORMAL HIGH (ref 65–99)
POTASSIUM: 5.6 mmol/L — AB (ref 3.5–5.1)
Sodium: 136 mmol/L (ref 135–145)

## 2016-03-31 NOTE — H&P (Signed)
Amy Sanders  [facility] Patient #: [id number] DOB: 07/11/1975 Married / Language: English / Race: White Female   History of Present Illness (Amy Groner A. Magnus IvanBlackman MD;  Patient words: reck.  The patient is a 40 year old female who presents with abdominal pain. This is a pleasant female referred by Dr. Willis ModenaWilliam Outlaw for evaluation of rectal quadrant abdominal pain. She has had several small attacks but then recently had one major attack of right upper quadrant abdominal pain with nausea but no vomiting. This occurred after a fatty meal. It lasted several hours. She has been on an antacid medication without any relief. She has had an ultrasound which is unremarkable and then had a HIDA scan showing her to have a 20% gallbladder ejection fraction. Symptoms were reproduced with Ensure. She is currently pain-free today. Bowel movements within normal. She is otherwise healthy without complaints. When she had the attack, the pain was sharp and moderate to severe in intensity. It did not refer anywhere else   Other Problems [name]([name], CMA;  Back Pain Diabetes Mellitus High blood pressure Hypercholesterolemia Migraine Headache  Past Surgical History [name]([name], CMA Appendectomy  Diagnostic Studies History [name]([name], CMA;  Colonoscopy never Mammogram 1-3 years ago Pap Smear 1-5 years ago  Allergies Lamar Laundry([name], CMA No Known Drug Allergies05/07/2015  Medication History ([name], CMA;   Lisinopril-Hydrochlorothiazide (20-12.5MG  Tablet, Oral) Active. MetFORMIN HCl ER (MOD) (1000MG  Tablet ER 24HR, Oral) Active. Omeprazole (20MG  Capsule DR, Oral) Active. Atorvastatin Calcium (20MG  Tablet, Oral) Active. Low-Ogestrel (0.3-30MG -MCG Tablet, Oral) Active. Methocarbamol (500MG  Tablet, Oral) Active. TraMADol HCl (50MG  Tablet, Oral as needed) Active. Medications Reconciled  Social History  Alcohol use Occasional alcohol  use. Caffeine use Coffee, Tea. No drug use Tobacco use Never smoker.  Family History [name]([name], CMA;  Arthritis Family Members In General, Mother. Diabetes Mellitus Family Members In General, Father, Mother. Hypertension Family Members In General, Father. Malignant Neoplasm Of Pancreas Family Members In General.  Pregnancy / Birth History [name]([name], CMA Age at menarche 10 years. Contraceptive History Oral contraceptives. Gravida 3 Irregular periods Maternal age 42-25 Para 3    Review of Systems Lamar Laundry([name] CMA;  General Present- Appetite Loss, Fatigue, Night Sweats and Weight Loss. Not Present- Chills, Fever and Weight Gain. Skin Not Present- Change in Wart/Mole, Dryness, Hives, Jaundice, New Lesions, Non-Healing Wounds, Rash and Ulcer. HEENT Present- Ringing in the Ears, Seasonal Allergies, Sinus Pain and Wears glasses/contact lenses. Not Present- Earache, Hearing Loss, Hoarseness, Nose Bleed, Oral Ulcers, Sore Throat, Visual Disturbances and Yellow Eyes. Respiratory Not Present- Bloody sputum, Chronic Cough, Difficulty Breathing, Snoring and Wheezing. Breast Not Present- Breast Mass, Breast Pain, Nipple Discharge and Skin Changes. Cardiovascular Not Present- Chest Pain, Difficulty Breathing Lying Down, Leg Cramps, Palpitations, Rapid Heart Rate, Shortness of Breath and Swelling of Extremities. Gastrointestinal Present- Change in Bowel Habits, Excessive gas and Nausea. Not Present- Abdominal Pain, Bloating, Bloody Stool, Chronic diarrhea, Constipation, Difficulty Swallowing, Gets full quickly at meals, Hemorrhoids, Indigestion, Rectal Pain and Vomiting. Female Genitourinary Present- Frequency. Not Present- Nocturia, Painful Urination, Pelvic Pain and Urgency. Musculoskeletal Present- Back Pain and Joint Pain. Not Present- Joint Stiffness, Muscle Pain, Muscle Weakness and Swelling of Extremities. Neurological Present- Headaches. Not Present- Decreased Memory,  Fainting, Numbness, Seizures, Tingling, Tremor, Trouble walking and Weakness. Psychiatric Not Present- Anxiety, Bipolar, Change in Sleep Pattern, Depression, Fearful and Frequent crying. Endocrine Present- Hair Changes. Not Present- Cold Intolerance, Excessive Hunger, Heat Intolerance, Hot flashes and New Diabetes. Hematology Not  Present- Easy Bruising, Excessive bleeding, Gland problems, HIV and Persistent Infections.  Vitals   Weight: 200 lb Height: 64in Body Surface Area: 1.96 m Body Mass Index: 34.33 kg/m  Temp.: 31F(Temporal)  Pulse: 76 (Regular)  BP: 128/80 (Sitting, Left Arm, Standard)    Physical Exam  General Mental Status-Alert. General Appearance-Consistent with stated age. Hydration-Well hydrated. Voice-Normal.  Head and Neck Head-normocephalic, atraumatic with no lesions or palpable masses.  Eye Eyeball - Bilateral-Extraocular movements intact. Sclera/Conjunctiva - Bilateral-No scleral icterus.  Chest and Lung Exam Chest and lung exam reveals -quiet, even and easy respiratory effort with no use of accessory muscles and on auscultation, normal breath sounds, no adventitious sounds and normal vocal resonance. Inspection Chest Wall - Normal. Back - normal.  Cardiovascular Cardiovascular examination reveals -on palpation PMI is normal in location and amplitude, no palpable S3 or S4. Normal cardiac borders., normal heart sounds, regular rate and rhythm with no murmurs, carotid auscultation reveals no bruits and normal pedal pulses bilaterally.  Abdomen Inspection Inspection of the abdomen reveals - No Hernias. Skin - Scar - no surgical scars. Palpation/Percussion Palpation and Percussion of the abdomen reveal - Soft, Non Tender, No Rebound tenderness, No Rigidity (guarding) and No hepatosplenomegaly. Auscultation Auscultation of the abdomen reveals - Bowel sounds normal.  Neurologic Neurologic evaluation reveals -alert and  oriented x 3 with no impairment of recent or remote memory. Mental Status-Normal.  Musculoskeletal Normal Exam - Left-Upper Extremity Strength Normal and Lower Extremity Strength Normal. Normal Exam - Right-Upper Extremity Strength Normal, Lower Extremity Weakness.    Assessment & Plan  BILIARY DYSKINESIA (K82.8)  Impression: I discussed the diagnosis with her in detail. We discussed continued conservative management versus laparoscopic cholecystectomy. I gave her literature regarding the surgery. I discussed the surgical procedure in detail. I discussed the risks which includes but is not limited to bleeding, infection, bile duct injury, bile leak, injury to other structures, the need to convert to an open procedure, postoperative recovery, chances may not resolve her symptoms, etc. She understands and wishes to proceed with surgery Current Plans Pt Education - Pamphlet Given - Laparoscopic Gallbladder Surgery: discussed with patient and provided information.

## 2016-04-01 ENCOUNTER — Encounter (HOSPITAL_BASED_OUTPATIENT_CLINIC_OR_DEPARTMENT_OTHER): Payer: Self-pay | Admitting: Certified Registered"

## 2016-04-01 ENCOUNTER — Ambulatory Visit (HOSPITAL_BASED_OUTPATIENT_CLINIC_OR_DEPARTMENT_OTHER): Payer: BLUE CROSS/BLUE SHIELD | Admitting: Certified Registered"

## 2016-04-01 ENCOUNTER — Encounter (HOSPITAL_BASED_OUTPATIENT_CLINIC_OR_DEPARTMENT_OTHER)
Admission: RE | Disposition: A | Discharge status: Home / Self Care | Payer: Self-pay | Source: Ambulatory Visit | Attending: Surgery

## 2016-04-01 ENCOUNTER — Ambulatory Visit (HOSPITAL_BASED_OUTPATIENT_CLINIC_OR_DEPARTMENT_OTHER)
Admission: RE | Admit: 2016-04-01 | Discharge: 2016-04-01 | Disposition: A | Discharge status: Home / Self Care | Payer: BLUE CROSS/BLUE SHIELD | Source: Ambulatory Visit | Attending: Surgery | Admitting: Surgery

## 2016-04-01 DIAGNOSIS — Z793 Long term (current) use of hormonal contraceptives: Secondary | ICD-10-CM | POA: Diagnosis not present

## 2016-04-01 DIAGNOSIS — E7800 Pure hypercholesterolemia, unspecified: Secondary | ICD-10-CM | POA: Insufficient documentation

## 2016-04-01 DIAGNOSIS — E119 Type 2 diabetes mellitus without complications: Secondary | ICD-10-CM | POA: Diagnosis not present

## 2016-04-01 DIAGNOSIS — K811 Chronic cholecystitis: Secondary | ICD-10-CM | POA: Insufficient documentation

## 2016-04-01 DIAGNOSIS — Z7984 Long term (current) use of oral hypoglycemic drugs: Secondary | ICD-10-CM | POA: Diagnosis not present

## 2016-04-01 DIAGNOSIS — R109 Unspecified abdominal pain: Secondary | ICD-10-CM | POA: Diagnosis present

## 2016-04-01 DIAGNOSIS — Z79899 Other long term (current) drug therapy: Secondary | ICD-10-CM | POA: Diagnosis not present

## 2016-04-01 DIAGNOSIS — I10 Essential (primary) hypertension: Secondary | ICD-10-CM | POA: Diagnosis not present

## 2016-04-01 HISTORY — PX: CHOLECYSTECTOMY: SHX55

## 2016-04-01 LAB — GLUCOSE, CAPILLARY
GLUCOSE-CAPILLARY: 149 mg/dL — AB (ref 65–99)
GLUCOSE-CAPILLARY: 191 mg/dL — AB (ref 65–99)

## 2016-04-01 SURGERY — LAPAROSCOPIC CHOLECYSTECTOMY
Anesthesia: General | Site: Abdomen

## 2016-04-01 MED ORDER — DEXAMETHASONE SODIUM PHOSPHATE 10 MG/ML IJ SOLN
INTRAMUSCULAR | Status: AC
  Filled 2016-04-01: qty 1

## 2016-04-01 MED ORDER — BUPIVACAINE HCL 0.5 % IJ SOLN
INTRAMUSCULAR | Status: DC | PRN
  Administered 2016-04-01: 20 mL

## 2016-04-01 MED ORDER — SUGAMMADEX SODIUM 200 MG/2ML IV SOLN
INTRAVENOUS | Status: DC | PRN
  Administered 2016-04-01: 200 mg via INTRAVENOUS

## 2016-04-01 MED ORDER — KETOROLAC TROMETHAMINE 30 MG/ML IJ SOLN
INTRAMUSCULAR | Status: AC
  Filled 2016-04-01: qty 1

## 2016-04-01 MED ORDER — METOCLOPRAMIDE HCL 5 MG/ML IJ SOLN
10.0000 mg | Freq: Once | INTRAMUSCULAR | Status: AC | PRN
  Administered 2016-04-01: 10 mg via INTRAVENOUS

## 2016-04-01 MED ORDER — OXYCODONE HCL 5 MG PO TABS: 5.0000 mg | ORAL_TABLET | ORAL | Status: DC | PRN

## 2016-04-01 MED ORDER — CHLORHEXIDINE GLUCONATE CLOTH 2 % EX PADS: 6.0000 | MEDICATED_PAD | Freq: Once | CUTANEOUS | Status: DC

## 2016-04-01 MED ORDER — SODIUM CHLORIDE 0.9% FLUSH
3.0000 mL | Freq: Two times a day (BID) | INTRAVENOUS | Status: DC
Start: 2016-04-01 — End: 2016-04-01

## 2016-04-01 MED ORDER — ROCURONIUM BROMIDE 10 MG/ML (PF) SYRINGE
PREFILLED_SYRINGE | INTRAVENOUS | Status: AC
  Filled 2016-04-01: qty 10

## 2016-04-01 MED ORDER — SCOPOLAMINE 1 MG/3DAYS TD PT72
1.0000 | MEDICATED_PATCH | Freq: Once | TRANSDERMAL | Status: DC | PRN
  Administered 2016-04-01: 1.5 mg via TRANSDERMAL

## 2016-04-01 MED ORDER — MORPHINE SULFATE (PF) 2 MG/ML IV SOLN: 1.0000 mg | INTRAVENOUS | Status: DC | PRN

## 2016-04-01 MED ORDER — SODIUM CHLORIDE 0.9% FLUSH: 3.0000 mL | INTRAVENOUS | Status: DC | PRN

## 2016-04-01 MED ORDER — SODIUM CHLORIDE 0.9 % IV SOLN: 250.0000 mL | INTRAVENOUS | Status: DC | PRN

## 2016-04-01 MED ORDER — CEFAZOLIN SODIUM-DEXTROSE 2-4 GM/100ML-% IV SOLN
2.0000 g | INTRAVENOUS | Status: AC
  Administered 2016-04-01: 2 g via INTRAVENOUS

## 2016-04-01 MED ORDER — FENTANYL CITRATE (PF) 100 MCG/2ML IJ SOLN
25.0000 ug | INTRAMUSCULAR | Status: DC | PRN
  Administered 2016-04-01 (×2): 50 ug via INTRAVENOUS

## 2016-04-01 MED ORDER — MIDAZOLAM HCL 2 MG/2ML IJ SOLN
INTRAMUSCULAR | Status: AC
  Filled 2016-04-01: qty 2

## 2016-04-01 MED ORDER — ACETAMINOPHEN 325 MG PO TABS: 650.0000 mg | ORAL_TABLET | ORAL | Status: DC | PRN

## 2016-04-01 MED ORDER — ACETAMINOPHEN 650 MG RE SUPP: 650.0000 mg | RECTAL | Status: DC | PRN

## 2016-04-01 MED ORDER — CEFAZOLIN SODIUM-DEXTROSE 2-4 GM/100ML-% IV SOLN
INTRAVENOUS | Status: AC
  Filled 2016-04-01: qty 100

## 2016-04-01 MED ORDER — FENTANYL CITRATE (PF) 100 MCG/2ML IJ SOLN
50.0000 ug | INTRAMUSCULAR | Status: AC | PRN
  Administered 2016-04-01: 100 ug via INTRAVENOUS
  Administered 2016-04-01 (×2): 50 ug via INTRAVENOUS

## 2016-04-01 MED ORDER — SUGAMMADEX SODIUM 200 MG/2ML IV SOLN
INTRAVENOUS | Status: AC
  Filled 2016-04-01: qty 2

## 2016-04-01 MED ORDER — PROPOFOL 10 MG/ML IV BOLUS
INTRAVENOUS | Status: DC | PRN
  Administered 2016-04-01: 200 mg via INTRAVENOUS

## 2016-04-01 MED ORDER — FENTANYL CITRATE (PF) 100 MCG/2ML IJ SOLN
INTRAMUSCULAR | Status: AC
  Filled 2016-04-01: qty 2

## 2016-04-01 MED ORDER — LIDOCAINE HCL (CARDIAC) 20 MG/ML IV SOLN
INTRAVENOUS | Status: DC | PRN
  Administered 2016-04-01: 60 mg via INTRAVENOUS

## 2016-04-01 MED ORDER — SCOPOLAMINE 1 MG/3DAYS TD PT72
MEDICATED_PATCH | TRANSDERMAL | Status: AC
  Filled 2016-04-01: qty 1

## 2016-04-01 MED ORDER — ONDANSETRON HCL 4 MG/2ML IJ SOLN
INTRAMUSCULAR | Status: AC
  Filled 2016-04-01: qty 2

## 2016-04-01 MED ORDER — MIDAZOLAM HCL 2 MG/2ML IJ SOLN
1.0000 mg | INTRAMUSCULAR | Status: DC | PRN
  Administered 2016-04-01: 2 mg via INTRAVENOUS

## 2016-04-01 MED ORDER — KETOROLAC TROMETHAMINE 30 MG/ML IJ SOLN
INTRAMUSCULAR | Status: DC | PRN
  Administered 2016-04-01: 30 mg via INTRAVENOUS

## 2016-04-01 MED ORDER — MEPERIDINE HCL 25 MG/ML IJ SOLN: 6.2500 mg | INTRAMUSCULAR | Status: DC | PRN

## 2016-04-01 MED ORDER — OXYCODONE-ACETAMINOPHEN 5-325 MG PO TABS: 1.0000 | ORAL_TABLET | ORAL | 0 refills | Status: DC | PRN

## 2016-04-01 MED ORDER — SODIUM CHLORIDE 0.9 % IR SOLN
Status: DC | PRN
  Administered 2016-04-01: 3000 mL

## 2016-04-01 MED ORDER — ONDANSETRON HCL 4 MG/2ML IJ SOLN
INTRAMUSCULAR | Status: DC | PRN
  Administered 2016-04-01: 4 mg via INTRAVENOUS

## 2016-04-01 MED ORDER — METOCLOPRAMIDE HCL 5 MG/ML IJ SOLN
INTRAMUSCULAR | Status: AC
  Filled 2016-04-01: qty 2

## 2016-04-01 MED ORDER — LACTATED RINGERS IV SOLN
INTRAVENOUS | Status: DC
  Administered 2016-04-01 (×2): via INTRAVENOUS

## 2016-04-01 MED ORDER — DEXAMETHASONE SODIUM PHOSPHATE 4 MG/ML IJ SOLN
INTRAMUSCULAR | Status: DC | PRN
  Administered 2016-04-01: 4 mg via INTRAVENOUS

## 2016-04-01 MED ORDER — LACTATED RINGERS IV SOLN: INTRAVENOUS | Status: DC

## 2016-04-01 MED ORDER — LIDOCAINE 2% (20 MG/ML) 5 ML SYRINGE
INTRAMUSCULAR | Status: AC
  Filled 2016-04-01: qty 5

## 2016-04-01 MED ORDER — ROCURONIUM BROMIDE 100 MG/10ML IV SOLN
INTRAVENOUS | Status: DC | PRN
  Administered 2016-04-01: 40 mg via INTRAVENOUS

## 2016-04-01 SURGICAL SUPPLY — 34 items
ADH SKN CLS APL DERMABOND .7 (GAUZE/BANDAGES/DRESSINGS) ×1
APPLIER CLIP 5 13 M/L LIGAMAX5 (MISCELLANEOUS) ×2
APR CLP MED LRG 5 ANG JAW (MISCELLANEOUS) ×1
BAG SPEC RTRVL LRG 6X4 10 (ENDOMECHANICALS)
BLADE CLIPPER SURG (BLADE) IMPLANT
CHLORAPREP W/TINT 26ML (MISCELLANEOUS) ×2 IMPLANT
CLIP APPLIE 5 13 M/L LIGAMAX5 (MISCELLANEOUS) ×1 IMPLANT
COVER MAYO STAND STRL (DRAPES) IMPLANT
DECANTER SPIKE VIAL GLASS SM (MISCELLANEOUS) ×2 IMPLANT
DERMABOND ADVANCED (GAUZE/BANDAGES/DRESSINGS) ×1
DERMABOND ADVANCED .7 DNX12 (GAUZE/BANDAGES/DRESSINGS) ×1 IMPLANT
DRAPE C-ARM 42X72 X-RAY (DRAPES) IMPLANT
ELECT REM PT RETURN 9FT ADLT (ELECTROSURGICAL) ×2
ELECTRODE REM PT RTRN 9FT ADLT (ELECTROSURGICAL) ×1 IMPLANT
FILTER SMOKE EVAC LAPAROSHD (FILTER) IMPLANT
GLOVE SURG SIGNA 7.5 PF LTX (GLOVE) ×2 IMPLANT
GOWN STRL REUS W/ TWL LRG LVL3 (GOWN DISPOSABLE) ×3 IMPLANT
GOWN STRL REUS W/TWL LRG LVL3 (GOWN DISPOSABLE) ×6
NS IRRIG 1000ML POUR BTL (IV SOLUTION) ×2 IMPLANT
PACK BASIN DAY SURGERY FS (CUSTOM PROCEDURE TRAY) ×2 IMPLANT
POUCH SPECIMEN RETRIEVAL 10MM (ENDOMECHANICALS) IMPLANT
SCISSORS LAP 5X35 DISP (ENDOMECHANICALS) IMPLANT
SET CHOLANGIOGRAPH 5 50 .035 (SET/KITS/TRAYS/PACK) IMPLANT
SET IRRIG TUBING LAPAROSCOPIC (IRRIGATION / IRRIGATOR) ×2 IMPLANT
SLEEVE ENDOPATH XCEL 5M (ENDOMECHANICALS) ×4 IMPLANT
SLEEVE SCD COMPRESS KNEE MED (MISCELLANEOUS) ×2 IMPLANT
SPECIMEN JAR SMALL (MISCELLANEOUS) ×2 IMPLANT
SUT MON AB 4-0 PC3 18 (SUTURE) ×2 IMPLANT
TOWEL OR 17X24 6PK STRL BLUE (TOWEL DISPOSABLE) ×2 IMPLANT
TRAY LAPAROSCOPIC (CUSTOM PROCEDURE TRAY) ×2 IMPLANT
TROCAR XCEL BLUNT TIP 100MML (ENDOMECHANICALS) ×2 IMPLANT
TROCAR XCEL NON-BLD 5MMX100MML (ENDOMECHANICALS) ×2 IMPLANT
TUBE CONNECTING 20X1/4 (TUBING) ×2 IMPLANT
TUBING INSUFFLATION (TUBING) ×2 IMPLANT

## 2016-04-01 NOTE — Op Note (Signed)

## 2016-04-01 NOTE — Anesthesia Procedure Notes (Signed)
Procedure Name: Intubation Date/Time: 04/01/2016 8:53 AM Performed by: Curly ShoresRAFT, Harold Mattes W Pre-anesthesia Checklist: Patient identified, Emergency Drugs available, Suction available and Patient being monitored Patient Re-evaluated:Patient Re-evaluated prior to inductionOxygen Delivery Method: Circle system utilized Preoxygenation: Pre-oxygenation with 100% oxygen Intubation Type: IV induction Ventilation: Mask ventilation without difficulty Laryngoscope Size: Miller and 2 Grade View: Grade I Tube type: Oral Tube size: 7.0 mm Number of attempts: 1 Airway Equipment and Method: Stylet Placement Confirmation: ETT inserted through vocal cords under direct vision,  positive ETCO2 and breath sounds checked- equal and bilateral Secured at: 22 cm Tube secured with: Tape Dental Injury: Teeth and Oropharynx as per pre-operative assessment

## 2016-04-01 NOTE — Discharge Instructions (Signed)
Post Anesthesia Home Care Instructions  Activity: Get plenty of rest for the remainder of the day. A responsible adult should stay with you for 24 hours following the procedure.  For the next 24 hours, DO NOT: -Drive a car -Advertising copywriterperate machinery -Drink alcoholic beverages -Take any medication unless instructed by your physician -Make any legal decisions or sign important papers.  Meals: Start with liquid foods such as gelatin or soup. Progress to regular foods as tolerated. Avoid greasy, spicy, heavy foods. If nausea and/or vomiting occur, drink only clear liquids until the nausea and/or vomiting subsides. Call your physician if vomiting continues.  Special Instructions/Symptoms: Your throat may feel dry or sore from the anesthesia or the breathing tube placed in your throat during surgery. If this causes discomfort, gargle with warm salt water. The discomfort should disappear within 24 hours.  If you had a scopolamine patch placed behind your ear for the management of post- operative nausea and/or vomiting:  1. The medication in the patch is effective for 72 hours, after which it should be removed.  Wrap patch in a tissue and discard in the trash. Wash hands thoroughly with soap and water. 2. You may remove the patch earlier than 72 hours if you experience unpleasant side effects which may include dry mouth, dizziness or visual disturbances. 3. Avoid touching the patch. Wash your hands with soap and water after contact with the patch.      [facility] ______[facility] LAPAROSCOPIC SURGERY: POST OP INSTRUCTIONS Always review your discharge instruction sheet given to you by the facility where your surgery was performed. IF YOU HAVE DISABILITY OR FAMILY LEAVE FORMS, YOU MUST BRING THEM TO THE OFFICE FOR PROCESSING.   DO NOT GIVE THEM TO YOUR DOCTOR.  1. A prescription for pain medication may be given to you upon discharge.  Take your pain medication as prescribed, if needed.   If narcotic pain medicine is not needed, then you may take acetaminophen (Tylenol) or ibuprofen (Advil) as needed. 2. Take your usually prescribed medications unless otherwise directed. 3. If you need a refill on your pain medication, please contact your pharmacy.  They will contact our office to request authorization. Prescriptions will not be filled after 5pm or on week-ends. 4. You should follow a light diet the first few days after arrival home, such as soup and crackers, etc.  Be sure to include lots of fluids daily. 5. Most patients will experience some swelling and bruising in the area of the incisions.  Ice packs will help.  Swelling and bruising can take several days to resolve.  6. It is common to experience some constipation if taking pain medication after surgery.  Increasing fluid intake and taking a stool softener (such as Colace) will usually help or prevent this problem from occurring.  A mild laxative (Milk of Magnesia or Miralax) should be taken according to package instructions if there are no bowel movements after 48 hours. 7. Unless discharge instructions indicate otherwise, you may remove your bandages 24-48 hours after surgery, and you may shower at that time.  You may have steri-strips (small skin tapes) in place directly over the incision.  These strips should be left on the skin for 7-10 days.  If your surgeon used skin glue on the incision, you may shower in 24 hours.  The glue will flake off over the next 2-3 weeks.  Any sutures or staples will be removed at the office during your follow-up visit. 8. ACTIVITIES:  You may  resume regular (light) daily activities beginning the next day--such as daily self-care, walking, climbing stairs--gradually increasing activities as tolerated.  You may have sexual intercourse when it is comfortable.  Refrain from any heavy lifting or straining until approved by your doctor. a. You may drive when you are no longer taking prescription pain  medication, you can comfortably wear a seatbelt, and you can safely maneuver your car and apply brakes. b. RETURN TO WORK:  __________________________________________________________ 9. You should see your doctor in the office for a follow-up appointment approximately 2-3 weeks after your surgery.  Make sure that you call for this appointment within a day or two after you arrive home to insure a convenient appointment time. 10. OTHER INSTRUCTIONS:NO LIFTING MORE THAN 15 POUNDS FOR 2 WEEKS 11. ICE PACK AND IBUPROFEN ALSO FOR PAIN __________________________________________________________________________________________________________________________ __________________________________________________________________________________________________________________________ WHEN TO CALL YOUR DOCTOR: 1. Fever over 101.0 2. Inability to urinate 3. Continued bleeding from incision. 4. Increased pain, redness, or drainage from the incision. 5. Increasing abdominal pain  The clinic staff is available to answer your questions during regular business hours.  Please dont hesitate to call and ask to speak to one of the nurses for clinical concerns.  If you have a medical emergency, go to the nearest emergency room or call 911.  A surgeon from [facility] is always on call at the hospital. [address], [address], [place], [place]  [address] ? [address] [address], [place], [place]   [id number] [phone] ? [phone] ? FAX [phone] Web site: [email or web address]

## 2016-04-01 NOTE — Interval H&P Note (Signed)
History and Physical Interval Note: no change in H and P  04/01/2016 8:04 AM  Amy Sanders  has presented today for surgery, with the diagnosis of biliary dyskinesia  The various methods of treatment have been discussed with the patient and family. After consideration of risks, benefits and other options for treatment, the patient has consented to  Procedure(s): LAPAROSCOPIC CHOLECYSTECTOMY (N/A) as a surgical intervention .  The patient's history has been reviewed, patient examined, no change in status, stable for surgery.  I have reviewed the patient's chart and labs.  Questions were answered to the patient's satisfaction.     [name] A

## 2016-04-01 NOTE — Anesthesia Postprocedure Evaluation (Signed)
Anesthesia Post Note  Patient: Rosaland LaoLillian S Hollywood  Procedure(s) Performed: Procedure(s) (LRB): LAPAROSCOPIC CHOLECYSTECTOMY (N/A)  Patient location during evaluation: PACU Anesthesia Type: General Level of consciousness: awake and alert Pain management: pain level controlled Vital Signs Assessment: post-procedure vital signs reviewed and stable Respiratory status: spontaneous breathing, nonlabored ventilation, respiratory function stable and patient connected to nasal cannula oxygen Cardiovascular status: blood pressure returned to baseline and stable Postop Assessment: no signs of nausea or vomiting Anesthetic complications: no    Last Vitals:  Vitals:   04/01/16 1015 04/01/16 1029  BP: 117/70 125/75  Pulse: 88 82  Resp: (!) 24 17  Temp:      Last Pain:  Vitals:   04/01/16 1015  TempSrc:   PainSc: 1                  Phillips Groutarignan, Tahisha Hakim

## 2016-04-01 NOTE — Anesthesia Preprocedure Evaluation (Signed)
Anesthesia Evaluation  Patient identified by MRN, date of birth, ID band Patient awake    Reviewed: Allergy & Precautions, NPO status , Patient's Chart, lab work & pertinent test results  Airway Mallampati: II  TM Distance: >3 FB Neck ROM: Full    Dental no notable dental hx.    Pulmonary neg pulmonary ROS,    Pulmonary exam normal breath sounds clear to auscultation       Cardiovascular hypertension, Pt. on medications Normal cardiovascular exam Rhythm:Regular Rate:Normal     Neuro/Psych negative neurological ROS  negative psych ROS   GI/Hepatic negative GI ROS, Neg liver ROS,   Endo/Other  diabetes, Type 2, Oral Hypoglycemic Agents  Renal/GU negative Renal ROS  negative genitourinary   Musculoskeletal negative musculoskeletal ROS (+)   Abdominal   Peds negative pediatric ROS (+)  Hematology negative hematology ROS (+)   Anesthesia Other Findings   Reproductive/Obstetrics negative OB ROS                            Anesthesia Physical Anesthesia Plan  ASA: II  Anesthesia Plan: General   Post-op Pain Management:    Induction: Intravenous  Airway Management Planned: Oral ETT  Additional Equipment:   Intra-op Plan:   Post-operative Plan: Extubation in OR  Informed Consent: I have reviewed the patients History and Physical, chart, labs and discussed the procedure including the risks, benefits and alternatives for the proposed anesthesia with the patient or authorized representative who has indicated his/her understanding and acceptance.   Dental advisory given  Plan Discussed with: CRNA  Anesthesia Plan Comments:         Anesthesia Quick Evaluation  

## 2016-04-01 NOTE — Transfer of Care (Signed)
Immediate Anesthesia Transfer of Care Note  Patient: Amy Sanders  Procedure(s) Performed: Procedure(s) with comments: LAPAROSCOPIC CHOLECYSTECTOMY (N/A) - LAPAROSCOPIC CHOLECYSTECTOMY  Patient Location: PACU  Anesthesia Type:General  Level of Consciousness: awake, alert , oriented and patient cooperative  Airway & Oxygen Therapy: Patient Spontanous Breathing and Patient connected to face mask oxygen  Post-op Assessment: Report given to RN, Post -op Vital signs reviewed and stable and Patient moving all extremities  Post vital signs: Reviewed and stable  Last Vitals:  Vitals:   04/01/16 0736  BP: (!) 167/76  Pulse: 86  Resp: 20  Temp: 36.9 C    Last Pain:  Vitals:   04/01/16 0736  TempSrc: Oral         Complications: No apparent anesthesia complications

## 2016-04-02 ENCOUNTER — Encounter (HOSPITAL_BASED_OUTPATIENT_CLINIC_OR_DEPARTMENT_OTHER): Payer: Self-pay | Admitting: Surgery

## 2016-09-30 IMAGING — CT CT ANGIO CHEST-ABD-PELV FOR DISSECTION W/ AND WO/W CM
2 of 9 series · 15 of 46 positions shown, 17 images · IV contrast (isovue)
Comparison: None.

CLINICAL DATA: Awakened at [DATE] by severe pain in her upper back.
Vomiting. Upper abdominal pain, diaphoresis and dyspnea as well.

EXAM:
CT ANGIOGRAPHY CHEST, ABDOMEN AND PELVIS
TECHNIQUE: Multidetector CT imaging through the chest, abdomen and pelvis was
performed using the standard protocol during bolus administration of
intravenous contrast. Multiplanar reconstructed images and MIPs were
obtained and reviewed to evaluate the vascular anatomy.
CONTRAST:  100 mL Isovue 370 intravenous

[Series 5: axial arterial · axial · arterial · 0.85mm/px · z∈[-507,+51]mm · 12 of 216 slices shown, 14 images]
[im 15/216  soft-tissue]
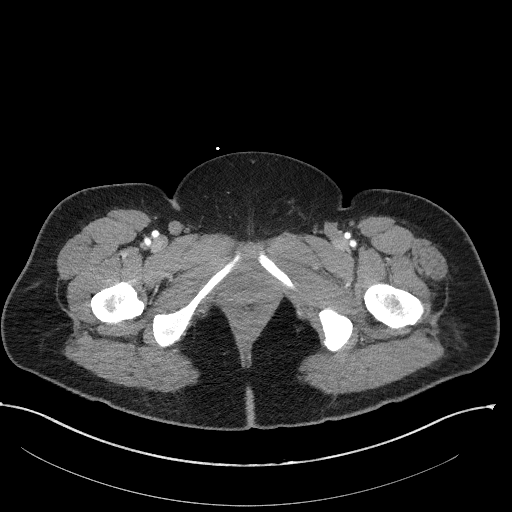
[im 15/216  bone]
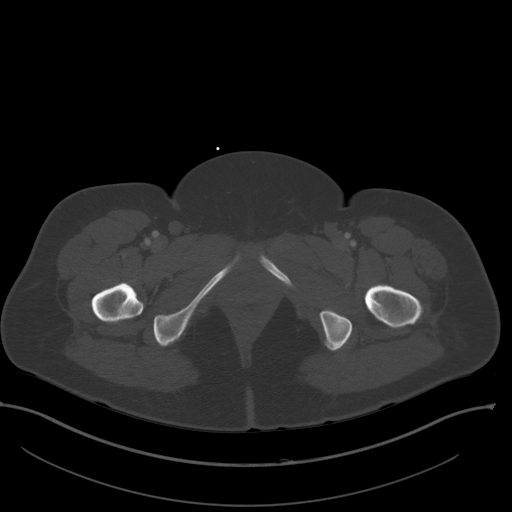
[im 29/216  soft-tissue]
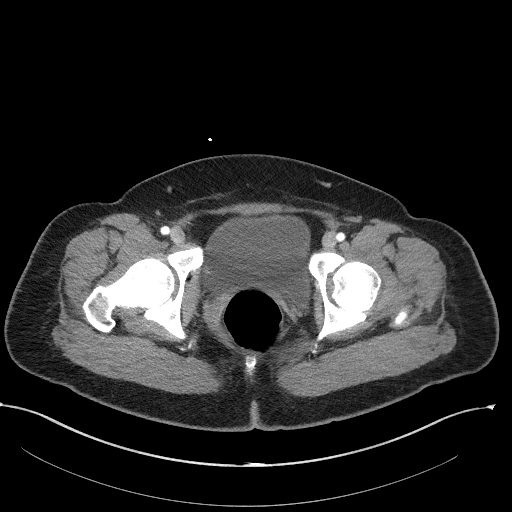
[im 44/216  soft-tissue]
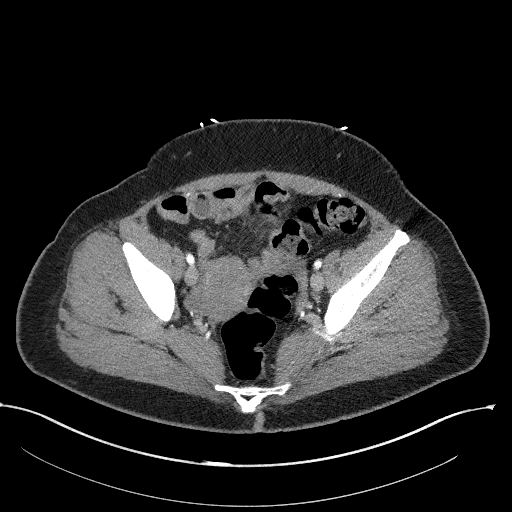
[im 72/216  soft-tissue]
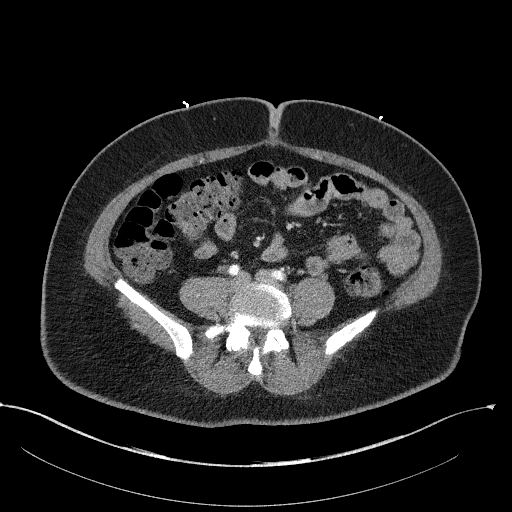
[im 87/216  soft-tissue]
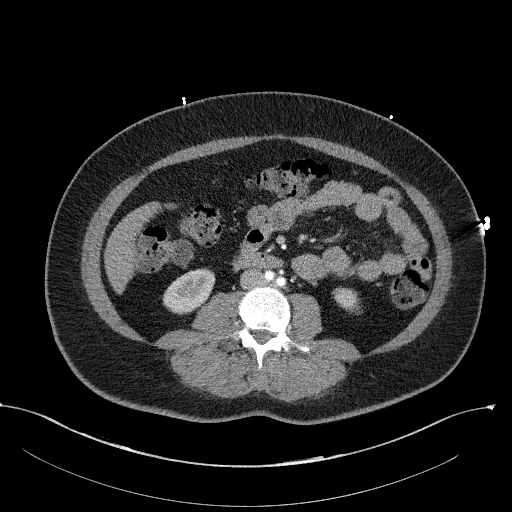
[im 101/216  soft-tissue]
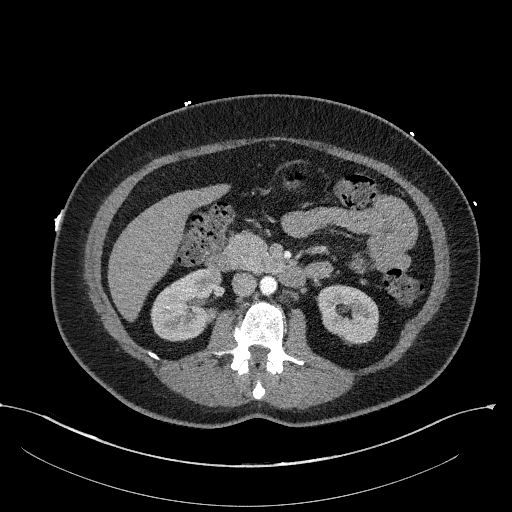
[im 115/216  soft-tissue]
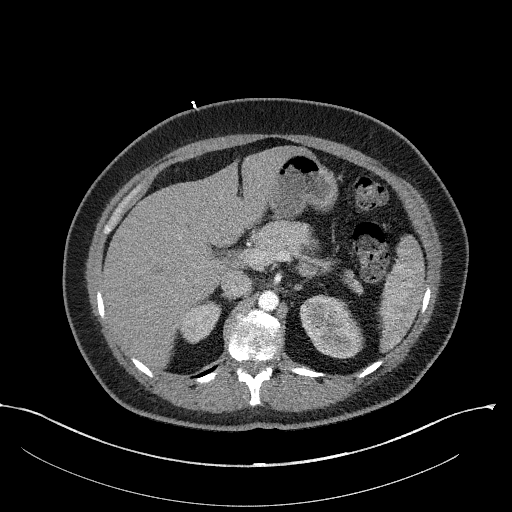
[im 130/216  soft-tissue]
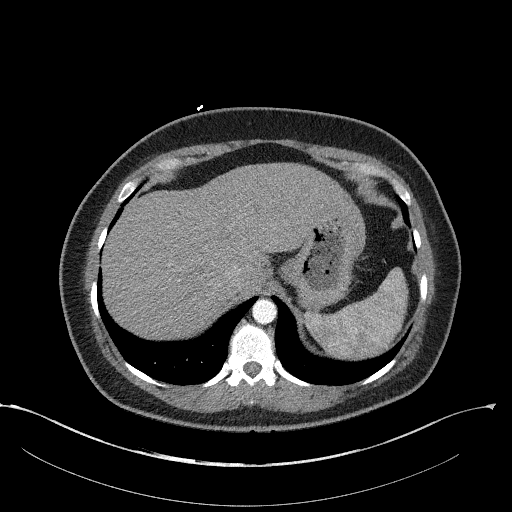
[im 144/216  soft-tissue]
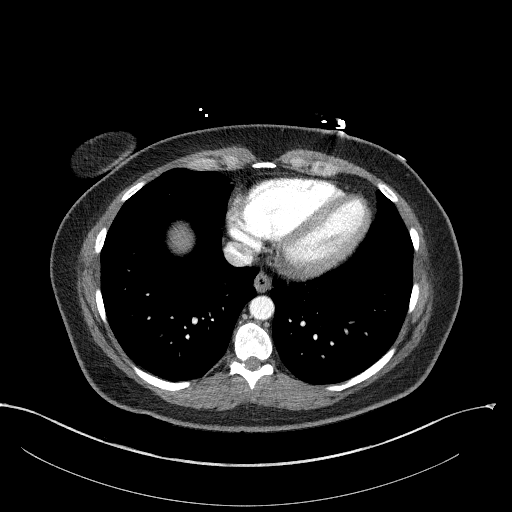
[im 144/216  bone]
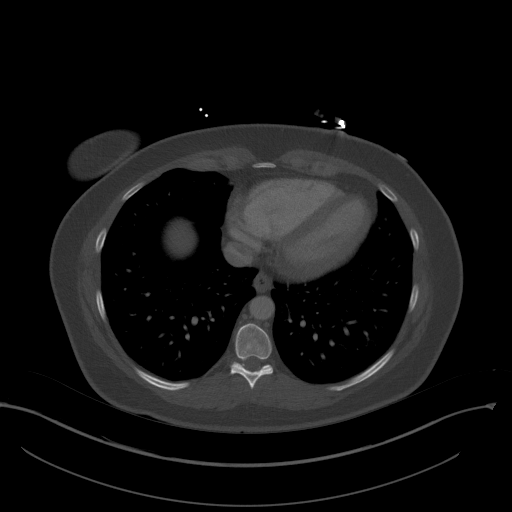
[im 173/216  soft-tissue]
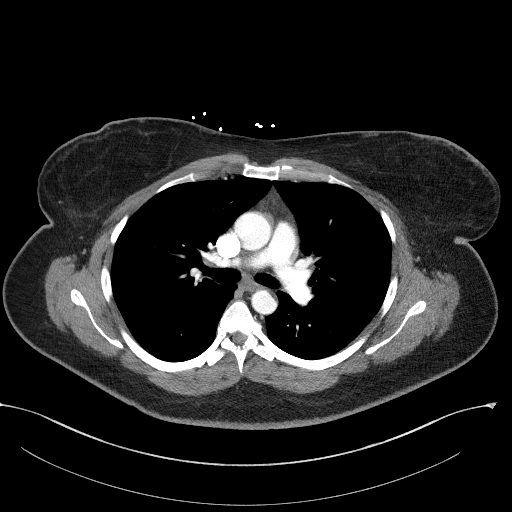
[im 187/216  soft-tissue]
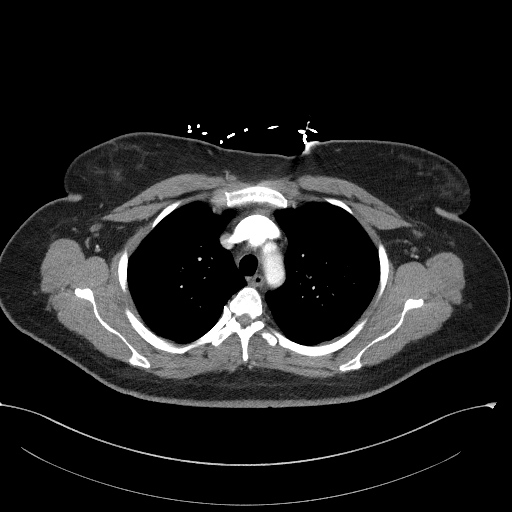
[im 201/216  soft-tissue]
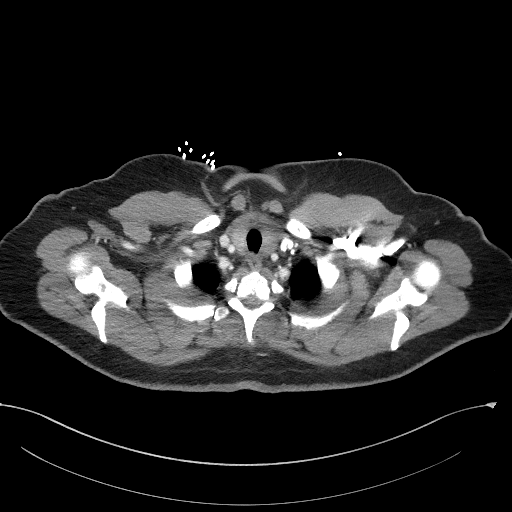

[Series 7: coronals · coronal · 0.78mm/px · 3 of 174 slices shown]
[im 44/174  soft-tissue]
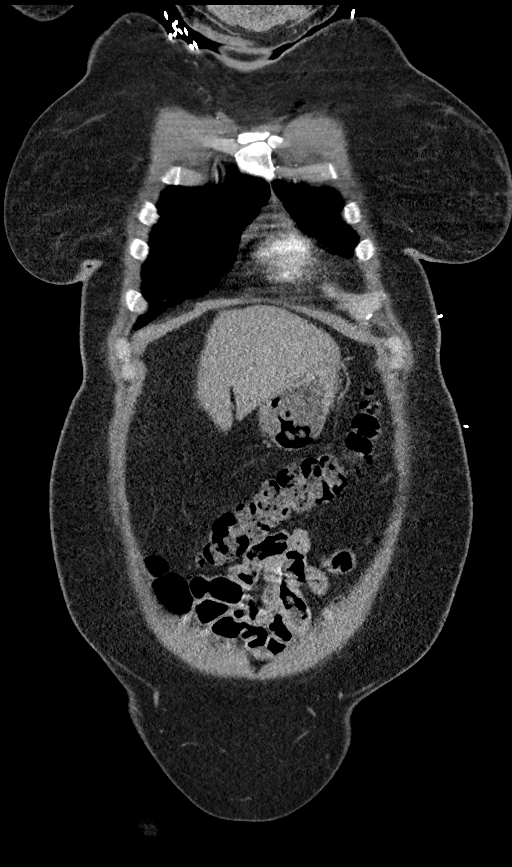
[im 87/174  soft-tissue]
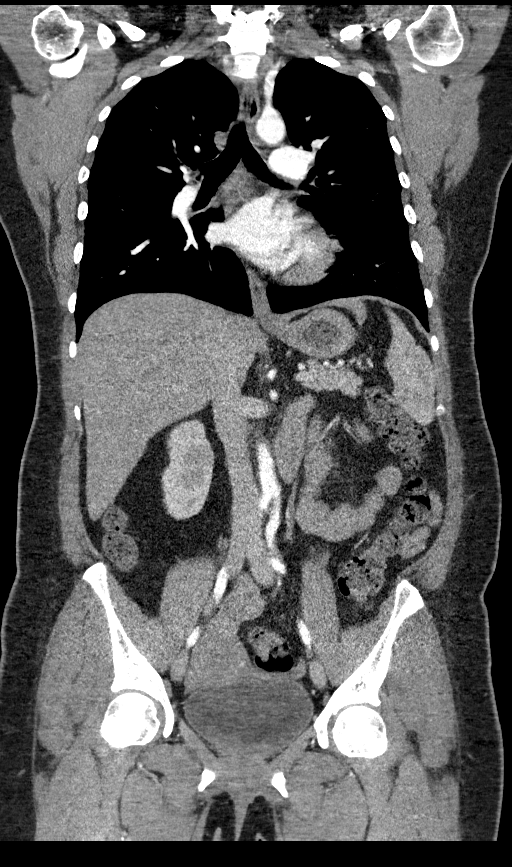
[im 130/174  soft-tissue]
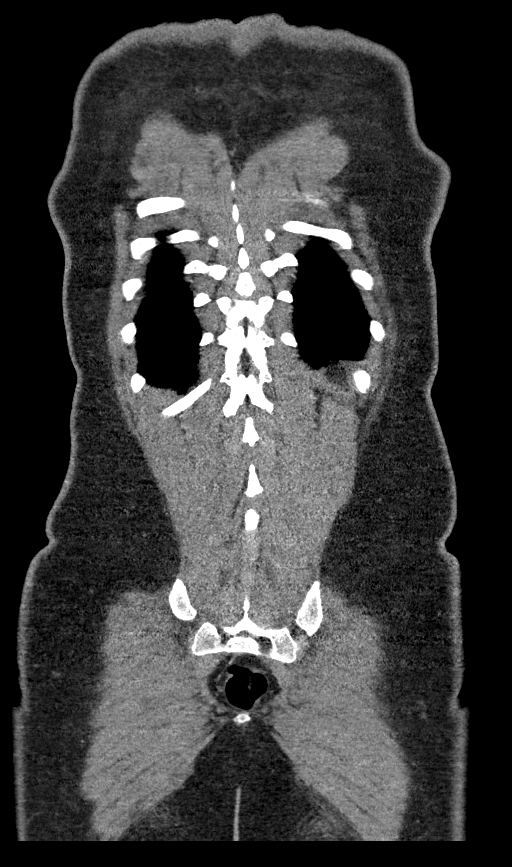

[15 of 46 positions shown; findings below may reference images not displayed]

FINDINGS: CTA CHEST FINDINGS

The thoracic aorta is normal in caliber and intact. There is no
dissection. There is no aneurysm. Great vessels are widely patent
proximally.

Review of the MIP images confirms the above findings.

Nonvascular findings in the chest:

No adenopathy. No effusions. The airways are patent. The lungs are
clear. No significant skeletal lesion.

CTA ABDOMEN AND PELVIS FINDINGS

The abdominal aorta is normal in caliber. The celiac, SMA and IMA
arteries are widely patent. Both single renal arteries are widely
patent. Iliac arteries are widely patent.

Review of the MIP images confirms the above findings.

Nonvascular findings in the abdomen and pelvis:

The liver, gallbladder and bile ducts are unremarkable. The
pancreas, spleen, adrenals and kidneys appear normal. Ureters and
urinary bladder are unremarkable. Probable uterine fibroids. No
adnexal abnormalities. Bowel is unremarkable. No acute inflammatory
changes are evident in the abdomen or pelvis. There is no
adenopathy. There is no ascites. No significant skeletal lesion is
evident.
IMPRESSION: 1. The thoracic and abdominal aorta is normal in caliber and intact.
No dissection or other acute vascular event.
2. No significant abnormality in the chest.
3. Probable uterine fibroids. Otherwise unremarkable abdomen and
pelvis.

## 2016-10-02 IMAGING — NM NM HEPATO W/GB/PHARM/[PERSON_NAME]
1 series · 12 of 12 positions shown · non-contrast
Comparison: Abdominal ultrasound August 26, 2015

CLINICAL DATA: Upper abdominal pain and nausea without vomiting

EXAM:
NUCLEAR MEDICINE HEPATOBILIARY IMAGING WITH GALLBLADDER EF
TECHNIQUE: Sequential images of the abdomen were obtained [DATE] minutes
following intravenous administration of radiopharmaceutical. After
oral ingestion of Ensure, gallbladder ejection fraction was
determined. At 60 min, normal ejection fraction is greater than 33%.
RADIOPHARMACEUTICALS:  5.2 millicuries mCi 0c-44m  Choletec IV

[Series 1: hepato · 4.46mm/px · 2 acquisitions, 12 frames shown]
[im 1/2]
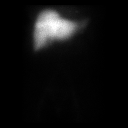
[im 1/2]
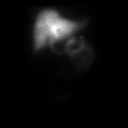
[im 1/2]
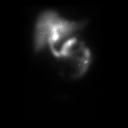
[im 1/2]
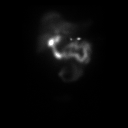
[im 1/2]
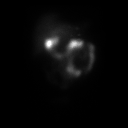
[im 1/2]
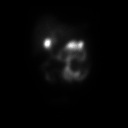
[im 2/2]
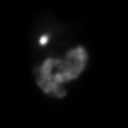
[im 2/2]
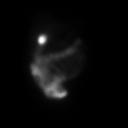
[im 2/2]
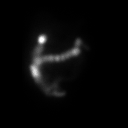
[im 2/2]
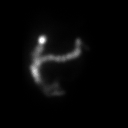
[im 2/2]
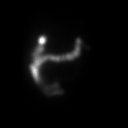
[im 2/2]
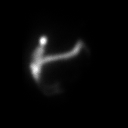

[12 of 12 positions shown; findings below may reference images not displayed]

FINDINGS: Prompt uptake and biliary excretion of activity by the liver is
seen. Gallbladder activity is visualized, consistent with patency of
cystic duct. Biliary activity passes into small bowel, consistent
with patent common bile duct.

Calculated gallbladder ejection fraction is 20%%. (Normal
gallbladder ejection fraction with Ensure is greater than 33%.)
IMPRESSION: No evidence of cystic duct or common bile duct obstruction.
Abnormally low gallbladder ejection fraction consistent with
gallbladder dysfunction.

## 2022-04-19 ENCOUNTER — Other Ambulatory Visit (HOSPITAL_BASED_OUTPATIENT_CLINIC_OR_DEPARTMENT_OTHER): Payer: Self-pay

## 2022-04-19 MED ORDER — OZEMPIC (2 MG/DOSE) 8 MG/3ML ~~LOC~~ SOPN
PEN_INJECTOR | SUBCUTANEOUS | 2 refills | Status: AC
  Filled 2022-04-19: qty 3, 28d supply, fill #0
  Filled 2022-05-29 (×2): qty 3, 28d supply, fill #1
  Filled 2022-06-27: qty 3, 28d supply, fill #2
  Filled 2022-07-24 (×2): qty 3, 28d supply, fill #3
  Filled 2022-08-18 – 2022-08-20 (×3): qty 3, 28d supply, fill #4

## 2022-04-20 ENCOUNTER — Other Ambulatory Visit (HOSPITAL_BASED_OUTPATIENT_CLINIC_OR_DEPARTMENT_OTHER): Payer: Self-pay

## 2022-05-29 ENCOUNTER — Other Ambulatory Visit (HOSPITAL_BASED_OUTPATIENT_CLINIC_OR_DEPARTMENT_OTHER): Payer: Self-pay

## 2022-05-29 ENCOUNTER — Other Ambulatory Visit: Payer: Self-pay

## 2022-07-24 ENCOUNTER — Other Ambulatory Visit (HOSPITAL_COMMUNITY): Payer: Self-pay

## 2022-07-24 ENCOUNTER — Other Ambulatory Visit (HOSPITAL_BASED_OUTPATIENT_CLINIC_OR_DEPARTMENT_OTHER): Payer: Self-pay

## 2022-08-18 ENCOUNTER — Other Ambulatory Visit (HOSPITAL_BASED_OUTPATIENT_CLINIC_OR_DEPARTMENT_OTHER): Payer: Self-pay

## 2022-08-19 ENCOUNTER — Other Ambulatory Visit (HOSPITAL_BASED_OUTPATIENT_CLINIC_OR_DEPARTMENT_OTHER): Payer: Self-pay

## 2022-08-20 ENCOUNTER — Other Ambulatory Visit (HOSPITAL_BASED_OUTPATIENT_CLINIC_OR_DEPARTMENT_OTHER): Payer: Self-pay

## 2023-12-09 ENCOUNTER — Other Ambulatory Visit: Payer: Self-pay | Admitting: Obstetrics and Gynecology

## 2023-12-09 DIAGNOSIS — R928 Other abnormal and inconclusive findings on diagnostic imaging of breast: Secondary | ICD-10-CM

## 2023-12-20 ENCOUNTER — Ambulatory Visit
Admission: RE | Admit: 2023-12-20 | Discharge: 2023-12-20 | Disposition: A | Discharge status: Home / Self Care | Source: Ambulatory Visit | Attending: Obstetrics and Gynecology | Admitting: Obstetrics and Gynecology

## 2023-12-20 ENCOUNTER — Other Ambulatory Visit: Payer: Self-pay | Admitting: Obstetrics and Gynecology

## 2023-12-20 DIAGNOSIS — R928 Other abnormal and inconclusive findings on diagnostic imaging of breast: Secondary | ICD-10-CM

## 2023-12-20 DIAGNOSIS — N6320 Unspecified lump in the left breast, unspecified quadrant: Secondary | ICD-10-CM

## 2023-12-21 ENCOUNTER — Ambulatory Visit
Admission: RE | Admit: 2023-12-21 | Discharge: 2023-12-21 | Disposition: A | Discharge status: Home / Self Care | Source: Ambulatory Visit | Attending: Obstetrics and Gynecology | Admitting: Obstetrics and Gynecology

## 2023-12-21 DIAGNOSIS — N6320 Unspecified lump in the left breast, unspecified quadrant: Secondary | ICD-10-CM

## 2023-12-21 DIAGNOSIS — R928 Other abnormal and inconclusive findings on diagnostic imaging of breast: Secondary | ICD-10-CM

## 2023-12-22 LAB — SURGICAL PATHOLOGY

## 2024-01-03 ENCOUNTER — Other Ambulatory Visit: Payer: Self-pay | Admitting: Surgery

## 2024-01-03 DIAGNOSIS — D242 Benign neoplasm of left breast: Secondary | ICD-10-CM

## 2024-01-30 ENCOUNTER — Other Ambulatory Visit: Payer: Self-pay | Admitting: Surgery

## 2024-01-30 DIAGNOSIS — D242 Benign neoplasm of left breast: Secondary | ICD-10-CM

## 2024-02-02 ENCOUNTER — Other Ambulatory Visit: Payer: Self-pay

## 2024-02-02 ENCOUNTER — Encounter (HOSPITAL_BASED_OUTPATIENT_CLINIC_OR_DEPARTMENT_OTHER): Payer: Self-pay | Admitting: Surgery

## 2024-02-02 ENCOUNTER — Encounter (HOSPITAL_BASED_OUTPATIENT_CLINIC_OR_DEPARTMENT_OTHER)
Admission: RE | Admit: 2024-02-02 | Discharge: 2024-02-02 | Disposition: A | Discharge status: Home / Self Care | Source: Ambulatory Visit | Attending: Surgery | Admitting: Surgery

## 2024-02-02 DIAGNOSIS — Z01810 Encounter for preprocedural cardiovascular examination: Secondary | ICD-10-CM | POA: Diagnosis present

## 2024-02-02 DIAGNOSIS — E119 Type 2 diabetes mellitus without complications: Secondary | ICD-10-CM | POA: Insufficient documentation

## 2024-02-02 MED ORDER — CHLORHEXIDINE GLUCONATE CLOTH 2 % EX PADS: 6.0000 | MEDICATED_PAD | Freq: Once | CUTANEOUS | Status: DC

## 2024-02-02 MED ORDER — ENSURE PRE-SURGERY PO LIQD: 296.0000 mL | Freq: Once | ORAL | Status: DC

## 2024-02-02 NOTE — Progress Notes (Signed)
   02/02/24 1245  PAT Phone Screen  Is the patient taking a GLP-1 receptor agonist? Yes (last dose 9/14)  Has the patient been informed on holding medication? Yes  Do You Have Diabetes? Yes  Do You Have Hypertension? Yes  Have You Ever Been to the ER for Asthma? No  Have You Taken Oral Steroids in the Past 3 Months? No  Do you Take Phenteramine or any Other Diet Drugs? No  Recent  Lab Work, EKG, CXR? Yes  Where was this test performed? [facility] 01/23/24 (CE see not 9/12 for results)  Do you have a history of heart problems? No  Any Recent Hospitalizations? No  Height 5' 4 (1.626 m)  Weight 78 kg  Pat Appointment Scheduled Yes (EKG)

## 2024-02-02 NOTE — Progress Notes (Signed)

## 2024-02-07 ENCOUNTER — Inpatient Hospital Stay
Admission: RE | Admit: 2024-02-07 | Discharge: 2024-02-07 | Disposition: A | Discharge status: Home / Self Care | Source: Ambulatory Visit | Attending: Surgery | Admitting: Surgery

## 2024-02-07 ENCOUNTER — Other Ambulatory Visit: Payer: Self-pay | Admitting: Surgery

## 2024-02-07 ENCOUNTER — Ambulatory Visit
Admission: RE | Admit: 2024-02-07 | Discharge: 2024-02-07 | Disposition: A | Discharge status: Home / Self Care | Source: Ambulatory Visit | Attending: Surgery | Admitting: Surgery

## 2024-02-07 DIAGNOSIS — D242 Benign neoplasm of left breast: Secondary | ICD-10-CM

## 2024-02-07 NOTE — H&P (Signed)
 REFERRING PHYSICIAN: Okey Marjorie Mania, MD PROVIDER: VICENTA DASIE POLI, MD MRN: [id number] DOB: 1976/01/09   Subjective   Chief Complaint: New Consultation  History of Present Illness: Amy Sanders is a 48 y.o. female who is seen   as an office consultation for evaluation of left breast papilloma   This is a pleasant 48 year old female who is accompanied by her husband for evaluation of an intraductal papilloma of the left breast. She had undergone a screening mammography when she was recalled for an asymmetry in the left breast. Repeat imaging and ultrasound showed a 1.3 cm mass at 3 o'clock position of the left breast in the upper outer quadrant. Ultrasound the axilla was unremarkable. She underwent a biopsy of the mass showing an intraductal papilloma with no atypia. She has had no previous problems regarding her breast. There is no family history of breast cancer. She is otherwise without complaints. She does have a history of type 2 diabetes, polycystic ovarian syndrome, and stage III chronic kidney disease. She denies nipple discharge.  Review of Systems: A complete review of systems was obtained from the patient. I have reviewed this information and discussed as appropriate with the patient. See HPI as well for other ROS.  ROS   Medical History: Past Medical History:  Diagnosis Date  Chronic kidney disease  Diabetes mellitus without complication (CMS/HHS-HCC)   There is no problem list on file for this patient.  Past Surgical History:  Procedure Laterality Date  APPENDECTOMY  CHOLECYSTECTOMY    Allergies  Allergen Reactions  Fish Containing Products Other (See Comments)  Violent vomiting  Violent vomiting Other reaction(s): GI Upset (intolerance) Violent vomiting   Current Outpatient Medications on File Prior to Visit  Medication Sig Dispense Refill  brimonidine-timoloL (COMBIGAN) 0.2-0.5 % ophthalmic solution Combigan 0.2 %-0.5 % eye drops INSTILL 1 DROP  IN BOTH EYES TWICE DAILY  lisinopriL (ZESTRIL) 20 MG tablet Take 20 mg by mouth once daily  metFORMIN (GLUCOPHAGE-XR) 500 MG XR tablet Take 500 mg by mouth once daily  OZEMPIC  2 mg/dose (8 mg/3 mL) pen injector Inject 0.75 mLs (2 mg total) into the skin every 7 days.  rosuvastatin (CRESTOR) 5 MG tablet Take 5 mg by mouth once daily   No current facility-administered medications on file prior to visit.   Family History  Problem Relation Age of Onset  High blood pressure (Hypertension) Mother  Diabetes Mother  Skin cancer Father  Hyperlipidemia (Elevated cholesterol) Father  High blood pressure (Hypertension) Father  Diabetes Father    Social History   Tobacco Use  Smoking Status Never  Smokeless Tobacco Never    Social History   Socioeconomic History  Marital status: Unknown  Tobacco Use  Smoking status: Never  Smokeless tobacco: Never  Vaping Use  Vaping status: Never Used  Substance and Sexual Activity  Alcohol use: Never  Drug use: Never   Social Drivers of Health   Food Insecurity: Low Risk (10/24/2023)  Received from [facility]  Hunger Vital Sign  Within the past 12 months, you worried that your food would run out before you got money to buy more: Never true  Within the past 12 months, the food you bought just didn't last and you didn't have money to get more. : Never true  Transportation Needs: No Transportation Needs (10/24/2023)  Received from [employer]  In the past 12 months, has lack of reliable transportation kept you from medical appointments, meetings, work or from getting things needed for daily  living? : No  Housing Stability: Unknown (01/03/2024)  Housing Stability Vital Sign  Homeless in the Last Year: No   Objective:   Vitals:   BP: 113/79  Pulse: 88  Temp: 36.8 C (98.2 F)  SpO2: 99%  Weight: 78.1 kg (172 lb 3.2 oz)  Height: 162.6 cm (5' 4)  PainSc: 0-No pain  PainLoc: Breast   Body mass index is 29.56  kg/m.  Physical Exam   A chaperone was present for the examination  She appears well on exam  There is minimal ecchymosis in the left breast from the biopsy. There are no palpable breast masses. There is no axillary adenopathy. The nipple areolar complex is normal in appearance  Labs, Imaging and Diagnostic Testing: I have reviewed her mammograms, ultrasound, and pathology results  Assessment and Plan:   Diagnoses and all orders for this visit:  Intraductal papilloma of breast, left   I discussed the diagnosis with the patient and her husband and gave them a copy of the pathology results showing the intraductal papilloma of the left breast. We discussed papillomas in detail. Although this is not a malignancy, given the size and her age, surgical excision is strongly recommended for complete histologic evaluation to rule out cancer or an other early malignancy with DCIS. I explained proceeding with a radioactive seed guided left breast lumpectomy. I explained the surgical procedure in detail. We discussed the risks which includes but is not limited to bleeding, infection, injury to surrounding structures, the need for further surgery if malignancy is found, cardiopulmonary issues with anesthesia, blood clots, postoperative recovery, etc. They understand and wish to proceed with surgery which will be scheduled

## 2024-02-08 ENCOUNTER — Ambulatory Visit (HOSPITAL_BASED_OUTPATIENT_CLINIC_OR_DEPARTMENT_OTHER): Admitting: Anesthesiology

## 2024-02-08 ENCOUNTER — Ambulatory Visit
Admission: RE | Admit: 2024-02-08 | Discharge: 2024-02-08 | Disposition: A | Discharge status: Home / Self Care | Source: Ambulatory Visit | Attending: Surgery | Admitting: Surgery

## 2024-02-08 ENCOUNTER — Encounter (HOSPITAL_BASED_OUTPATIENT_CLINIC_OR_DEPARTMENT_OTHER)
Admission: RE | Disposition: A | Discharge status: Home / Self Care | Payer: Self-pay | Source: Home / Self Care | Attending: Surgery

## 2024-02-08 ENCOUNTER — Ambulatory Visit (HOSPITAL_BASED_OUTPATIENT_CLINIC_OR_DEPARTMENT_OTHER)
Admission: RE | Admit: 2024-02-08 | Discharge: 2024-02-08 | Disposition: A | Discharge status: Home / Self Care | Attending: Surgery | Admitting: Surgery

## 2024-02-08 ENCOUNTER — Encounter (HOSPITAL_BASED_OUTPATIENT_CLINIC_OR_DEPARTMENT_OTHER): Payer: Self-pay | Admitting: Surgery

## 2024-02-08 ENCOUNTER — Other Ambulatory Visit: Payer: Self-pay

## 2024-02-08 DIAGNOSIS — Z01818 Encounter for other preprocedural examination: Secondary | ICD-10-CM

## 2024-02-08 DIAGNOSIS — D242 Benign neoplasm of left breast: Secondary | ICD-10-CM

## 2024-02-08 DIAGNOSIS — Z7985 Long-term (current) use of injectable non-insulin antidiabetic drugs: Secondary | ICD-10-CM | POA: Diagnosis not present

## 2024-02-08 DIAGNOSIS — Z7984 Long term (current) use of oral hypoglycemic drugs: Secondary | ICD-10-CM | POA: Diagnosis not present

## 2024-02-08 DIAGNOSIS — I129 Hypertensive chronic kidney disease with stage 1 through stage 4 chronic kidney disease, or unspecified chronic kidney disease: Secondary | ICD-10-CM | POA: Diagnosis not present

## 2024-02-08 DIAGNOSIS — E1122 Type 2 diabetes mellitus with diabetic chronic kidney disease: Secondary | ICD-10-CM | POA: Diagnosis not present

## 2024-02-08 DIAGNOSIS — E282 Polycystic ovarian syndrome: Secondary | ICD-10-CM | POA: Diagnosis not present

## 2024-02-08 DIAGNOSIS — Z79899 Other long term (current) drug therapy: Secondary | ICD-10-CM | POA: Diagnosis not present

## 2024-02-08 DIAGNOSIS — N183 Chronic kidney disease, stage 3 unspecified: Secondary | ICD-10-CM | POA: Insufficient documentation

## 2024-02-08 DIAGNOSIS — E119 Type 2 diabetes mellitus without complications: Secondary | ICD-10-CM

## 2024-02-08 HISTORY — DX: Other specified postprocedural states: Z98.890

## 2024-02-08 LAB — GLUCOSE, CAPILLARY
Glucose-Capillary: 95 mg/dL (ref 70–99)
Glucose-Capillary: 105 mg/dL — ABNORMAL HIGH (ref 70–99)

## 2024-02-08 LAB — POCT PREGNANCY, URINE: Preg Test, Ur: NEGATIVE

## 2024-02-08 SURGERY — RADIOACTIVE SEED GUIDED BREAST BIOPSY
Anesthesia: General | Site: Breast | Laterality: Left

## 2024-02-08 MED ORDER — BUPIVACAINE-EPINEPHRINE 0.5% -1:200000 IJ SOLN
INTRAMUSCULAR | Status: DC | PRN
  Administered 2024-02-08: 15 mL

## 2024-02-08 MED ORDER — PROPOFOL 10 MG/ML IV BOLUS
INTRAVENOUS | Status: DC | PRN
  Administered 2024-02-08: 200 mg via INTRAVENOUS

## 2024-02-08 MED ORDER — AMISULPRIDE (ANTIEMETIC) 5 MG/2ML IV SOLN: 10.0000 mg | Freq: Once | INTRAVENOUS | Status: DC | PRN

## 2024-02-08 MED ORDER — CEFAZOLIN SODIUM-DEXTROSE 2-4 GM/100ML-% IV SOLN
2.0000 g | INTRAVENOUS | Status: AC
  Administered 2024-02-08: 2 g via INTRAVENOUS

## 2024-02-08 MED ORDER — SODIUM CHLORIDE 0.9 % IV SOLN
12.5000 mg | INTRAVENOUS | Status: DC | PRN
  Filled 2024-02-08: qty 0.5

## 2024-02-08 MED ORDER — OXYCODONE HCL 5 MG/5ML PO SOLN: 5.0000 mg | Freq: Once | ORAL | Status: DC | PRN

## 2024-02-08 MED ORDER — PROPOFOL 10 MG/ML IV BOLUS
INTRAVENOUS | Status: AC
  Filled 2024-02-08: qty 20

## 2024-02-08 MED ORDER — ACETAMINOPHEN 500 MG PO TABS
1000.0000 mg | ORAL_TABLET | ORAL | Status: AC
  Administered 2024-02-08: 1000 mg via ORAL

## 2024-02-08 MED ORDER — ONDANSETRON HCL 4 MG/2ML IJ SOLN
INTRAMUSCULAR | Status: DC | PRN
  Administered 2024-02-08: 4 mg via INTRAVENOUS

## 2024-02-08 MED ORDER — FENTANYL CITRATE (PF) 100 MCG/2ML IJ SOLN
INTRAMUSCULAR | Status: AC
  Filled 2024-02-08: qty 2

## 2024-02-08 MED ORDER — FENTANYL CITRATE (PF) 100 MCG/2ML IJ SOLN
INTRAMUSCULAR | Status: DC | PRN
  Administered 2024-02-08 (×2): 50 ug via INTRAVENOUS

## 2024-02-08 MED ORDER — DEXAMETHASONE SODIUM PHOSPHATE 10 MG/ML IJ SOLN
INTRAMUSCULAR | Status: AC
  Filled 2024-02-08: qty 1

## 2024-02-08 MED ORDER — LIDOCAINE 2% (20 MG/ML) 5 ML SYRINGE
INTRAMUSCULAR | Status: AC
  Filled 2024-02-08: qty 5

## 2024-02-08 MED ORDER — MEPERIDINE HCL 25 MG/ML IJ SOLN: 6.2500 mg | INTRAMUSCULAR | Status: DC | PRN

## 2024-02-08 MED ORDER — DEXAMETHASONE SODIUM PHOSPHATE 10 MG/ML IJ SOLN
INTRAMUSCULAR | Status: DC | PRN
  Administered 2024-02-08: 5 mg via INTRAVENOUS

## 2024-02-08 MED ORDER — TRAMADOL HCL 50 MG PO TABS: 50.0000 mg | ORAL_TABLET | Freq: Four times a day (QID) | ORAL | 0 refills | Status: AC | PRN

## 2024-02-08 MED ORDER — PHENYLEPHRINE HCL (PRESSORS) 10 MG/ML IV SOLN
INTRAVENOUS | Status: DC | PRN
  Administered 2024-02-08: 80 ug via INTRAVENOUS

## 2024-02-08 MED ORDER — LIDOCAINE HCL (CARDIAC) PF 100 MG/5ML IV SOSY
PREFILLED_SYRINGE | INTRAVENOUS | Status: DC | PRN
  Administered 2024-02-08: 40 mg via INTRAVENOUS

## 2024-02-08 MED ORDER — ONDANSETRON HCL 4 MG/2ML IJ SOLN
INTRAMUSCULAR | Status: AC
Start: 2024-02-08 — End: 2024-02-08
  Filled 2024-02-08: qty 2

## 2024-02-08 MED ORDER — KETOROLAC TROMETHAMINE 30 MG/ML IJ SOLN
INTRAMUSCULAR | Status: AC
  Filled 2024-02-08: qty 2

## 2024-02-08 MED ORDER — LACTATED RINGERS IV SOLN
INTRAVENOUS | Status: DC
Start: 2024-02-08 — End: 2024-02-08

## 2024-02-08 MED ORDER — ACETAMINOPHEN 500 MG PO TABS
ORAL_TABLET | ORAL | Status: AC
  Filled 2024-02-08: qty 2

## 2024-02-08 MED ORDER — CEFAZOLIN SODIUM-DEXTROSE 2-4 GM/100ML-% IV SOLN
INTRAVENOUS | Status: AC
  Filled 2024-02-08: qty 100

## 2024-02-08 MED ORDER — MIDAZOLAM HCL 5 MG/5ML IJ SOLN
INTRAMUSCULAR | Status: DC | PRN
  Administered 2024-02-08: 2 mg via INTRAVENOUS

## 2024-02-08 MED ORDER — HYDROMORPHONE HCL 1 MG/ML IJ SOLN: 0.2500 mg | INTRAMUSCULAR | Status: DC | PRN

## 2024-02-08 MED ORDER — MIDAZOLAM HCL 2 MG/2ML IJ SOLN
INTRAMUSCULAR | Status: AC
  Filled 2024-02-08: qty 2

## 2024-02-08 MED ORDER — PHENYLEPHRINE 80 MCG/ML (10ML) SYRINGE FOR IV PUSH (FOR BLOOD PRESSURE SUPPORT)
PREFILLED_SYRINGE | INTRAVENOUS | Status: AC
  Filled 2024-02-08: qty 10

## 2024-02-08 MED ORDER — OXYCODONE HCL 5 MG PO TABS: 5.0000 mg | ORAL_TABLET | Freq: Once | ORAL | Status: DC | PRN

## 2024-02-08 MED ORDER — PROPOFOL 10 MG/ML IV BOLUS
INTRAVENOUS | Status: AC
Start: 2024-02-08 — End: 2024-02-08
  Filled 2024-02-08: qty 20

## 2024-02-08 MED ORDER — PROPOFOL 500 MG/50ML IV EMUL
INTRAVENOUS | Status: DC | PRN
  Administered 2024-02-08: 150 ug/kg/min via INTRAVENOUS

## 2024-02-08 SURGICAL SUPPLY — 38 items
BINDER BREAST 3XL (GAUZE/BANDAGES/DRESSINGS) IMPLANT
BINDER BREAST LRG (GAUZE/BANDAGES/DRESSINGS) IMPLANT
BINDER BREAST MEDIUM (GAUZE/BANDAGES/DRESSINGS) IMPLANT
BINDER BREAST XLRG (GAUZE/BANDAGES/DRESSINGS) IMPLANT
BINDER BREAST XXLRG (GAUZE/BANDAGES/DRESSINGS) IMPLANT
BLADE SURG 15 STRL LF DISP TIS (BLADE) ×1 IMPLANT
CANISTER SUC SOCK COL 7IN (MISCELLANEOUS) IMPLANT
CANISTER SUCT 1200ML W/VALVE (MISCELLANEOUS) IMPLANT
CHLORAPREP W/TINT 26 (MISCELLANEOUS) ×1 IMPLANT
CLIP APPLIE 9.375 MED OPEN (MISCELLANEOUS) IMPLANT
COVER BACK TABLE 60X90IN (DRAPES) ×1 IMPLANT
COVER MAYO STAND STRL (DRAPES) ×1 IMPLANT
COVER PROBE CYLINDRICAL 5X96 (MISCELLANEOUS) ×1 IMPLANT
DERMABOND ADVANCED .7 DNX12 (GAUZE/BANDAGES/DRESSINGS) ×1 IMPLANT
DRAPE LAPAROSCOPIC ABDOMINAL (DRAPES) ×1 IMPLANT
DRAPE UTILITY XL STRL (DRAPES) ×1 IMPLANT
ELECTRODE REM PT RTRN 9FT ADLT (ELECTROSURGICAL) ×1 IMPLANT
GAUZE SPONGE 4X4 12PLY STRL LF (GAUZE/BANDAGES/DRESSINGS) IMPLANT
GLOVE SURG SIGNA 7.5 PF LTX (GLOVE) ×1 IMPLANT
GOWN STRL REUS W/ TWL LRG LVL3 (GOWN DISPOSABLE) ×1 IMPLANT
GOWN STRL REUS W/ TWL XL LVL3 (GOWN DISPOSABLE) ×1 IMPLANT
KIT MARKER MARGIN INK (KITS) ×1 IMPLANT
NDL HYPO 25X1 1.5 SAFETY (NEEDLE) ×1 IMPLANT
NEEDLE HYPO 25X1 1.5 SAFETY (NEEDLE) ×1 IMPLANT
NS IRRIG 1000ML POUR BTL (IV SOLUTION) IMPLANT
PACK BASIN DAY SURGERY FS (CUSTOM PROCEDURE TRAY) ×1 IMPLANT
PENCIL SMOKE EVACUATOR (MISCELLANEOUS) ×1 IMPLANT
SLEEVE SCD COMPRESS KNEE MED (STOCKING) ×1 IMPLANT
SPIKE FLUID TRANSFER (MISCELLANEOUS) IMPLANT
SPONGE T-LAP 4X18 ~~LOC~~+RFID (SPONGE) ×1 IMPLANT
SUT MNCRL AB 4-0 PS2 18 (SUTURE) ×1 IMPLANT
SUT SILK 2 0 SH (SUTURE) IMPLANT
SUT VIC AB 3-0 SH 27X BRD (SUTURE) ×1 IMPLANT
SYR CONTROL 10ML LL (SYRINGE) ×1 IMPLANT
TOWEL GREEN STERILE FF (TOWEL DISPOSABLE) ×1 IMPLANT
TRAY FAXITRON CT DISP (TRAY / TRAY PROCEDURE) ×1 IMPLANT
TUBE CONNECTING 20X1/4 (TUBING) IMPLANT
YANKAUER SUCT BULB TIP NO VENT (SUCTIONS) IMPLANT

## 2024-02-08 NOTE — Anesthesia Preprocedure Evaluation (Addendum)
 Anesthesia Evaluation  Patient identified by MRN, date of birth, ID band Patient awake    Reviewed: Allergy & Precautions, NPO status , Patient's Chart, lab work & pertinent test results  History of Anesthesia Complications (+) PONV and history of anesthetic complications  Airway Mallampati: II  TM Distance: >3 FB Neck ROM: Full    Dental no notable dental hx.    Pulmonary neg pulmonary ROS   Pulmonary exam normal breath sounds clear to auscultation       Cardiovascular hypertension, Pt. on medications Normal cardiovascular exam Rhythm:Regular Rate:Normal     Neuro/Psych negative neurological ROS  negative psych ROS   GI/Hepatic negative GI ROS, Neg liver ROS,,,  Endo/Other  diabetes, Type 2, Oral Hypoglycemic Agents    Renal/GU negative Renal ROS  negative genitourinary   Musculoskeletal negative musculoskeletal ROS (+)    Abdominal   Peds negative pediatric ROS (+)  Hematology negative hematology ROS (+)   Anesthesia Other Findings   Reproductive/Obstetrics negative OB ROS                              Anesthesia Physical Anesthesia Plan  ASA: 3  Anesthesia Plan: General   Post-op Pain Management: Tylenol  PO (pre-op)*   Induction: Intravenous  PONV Risk Score and Plan: 4 or greater and Ondansetron , Midazolam , TIVA, Dexamethasone  and Treatment may vary due to age or medical condition  Airway Management Planned: LMA  Additional Equipment:   Intra-op Plan:   Post-operative Plan: Extubation in OR  Informed Consent: I have reviewed the patients History and Physical, chart, labs and discussed the procedure including the risks, benefits and alternatives for the proposed anesthesia with the patient or authorized representative who has indicated his/her understanding and acceptance.     Dental advisory given  Plan Discussed with: CRNA  Anesthesia Plan Comments:           Anesthesia Quick Evaluation

## 2024-02-08 NOTE — Anesthesia Procedure Notes (Signed)
 Procedure Name: LMA Insertion Date/Time: 02/08/2024 9:52 AM  Performed by: Debarah Chiquita LABOR, CRNAPre-anesthesia Checklist: Patient identified, Emergency Drugs available, Suction available and Patient being monitored Patient Re-evaluated:Patient Re-evaluated prior to induction Oxygen Delivery Method: Circle system utilized Preoxygenation: Pre-oxygenation with 100% oxygen Induction Type: IV induction Ventilation: Mask ventilation without difficulty LMA: LMA inserted LMA Size: 4.0 Number of attempts: 1 Airway Equipment and Method: Bite block Placement Confirmation: positive ETCO2 Tube secured with: Tape Dental Injury: Teeth and Oropharynx as per pre-operative assessment

## 2024-02-08 NOTE — Interval H&P Note (Signed)
 History and Physical Interval Note:no change in H and P  02/08/2024 8:05 AM  Amy Sanders Cart  has presented today for surgery, with the diagnosis of LEFT BREAST INTRADUCTAL PAPILLOMA.  The various methods of treatment have been discussed with the patient and family. After consideration of risks, benefits and other options for treatment, the patient has consented to  Procedure(s) with comments: RADIOACTIVE SEED GUIDED BREAST BIOPSY (Left) - RADIOACTIVE SEED GUIDED LEFT BREAST LUMPECTOMY as a surgical intervention.  The patient's history has been reviewed, patient examined, no change in status, stable for surgery.  I have reviewed the patient's chart and labs.  Questions were answered to the patient's satisfaction.     [name]

## 2024-02-08 NOTE — Op Note (Signed)
   DLYNN RANES 02/08/2024   Pre-op Diagnosis: LEFT BREAST INTRADUCTAL PAPILLOMA     Post-op Diagnosis: same  Procedure(s): RADIOACTIVE SEED GUIDED EXCISION LEFT BREAST PAPILLOMA  Surgeon(s): Vernetta Berg, MD Maczis, Tonja Barban, PA-C  Anesthesia: General  Staff:  Circulator: Wilmon Antonio SQUIBB, RN Scrub Person: Celestia Barer K  Estimated Blood Loss: Minimal               Specimens: sent to path  Indications: This is a 48 year old female was found of a mass in the left breast on screening mammography.  She underwent a biopsy of the mass which measured 1.3 cm.  The biopsy showed an intraductal papilloma.  Given the size and her age, surgical excision was recommended for complete histologic evaluation to rule out a malignancy  Procedure: The patient was brought to the operating room and identified as the correct patient.  She was placed upon the operating table and general anesthesia was induced.  Her left breast was prepped and draped in the usual sterile fashion.  Using the neoprobe the radioactive seed was located at the 3 o'clock position just underneath the lateral edge of the left areola.  I anesthetized the lateral edge of the areola with Marcaine  and then made a circumareolar incision with a scalpel.  I then dissected down circumferentially into the breast tissue with the electrocautery.  With the aid of the neoprobe the radioactive seed was easily identified.  I again continued dissection circumferentially around the signal from the seed and then got posterior to the signal completed the lumpectomy.  I marked all margins with paint on the lumpectomy specimen.  An x-ray was performed on the specimen confirming the radioactive seed and previous biopsy clip are in the specimen.  The specimen was then sent to pathology for evaluation.  Hemostasis was achieved with the cautery.  I injected further Marcaine  into the lumpectomy site.  We then closed the subcutaneous tissue with  interrupted 3-0 Vicryl sutures and closed the skin with a running 4-0 Monocryl.  Dermabond was then applied.  The patient tolerated the procedure well.  All the counts were correct at the end of the procedure.  She was placed in a breast binder and then extubated in the operating room and taken in stable condition to the recovery room.          Berg Vernetta   Date: 02/08/2024  Time: 10:11 AM

## 2024-02-08 NOTE — Transfer of Care (Signed)
 Immediate Anesthesia Transfer of Care Note  Patient: Amy Sanders  Procedure(s) Performed: RADIOACTIVE SEED GUIDED BREAST BIOPSY (Left: Breast)  Patient Location: PACU  Anesthesia Type:General  Level of Consciousness: drowsy  Airway & Oxygen Therapy: Patient Spontanous Breathing and Patient connected to face mask oxygen  Post-op Assessment: Report given to RN and Post -op Vital signs reviewed and stable  Post vital signs: Reviewed and stable  Last Vitals:  Vitals Value Taken Time  BP 110/70 02/08/24 10:30  Temp    Pulse 76 02/08/24 10:31  Resp 10 02/08/24 10:31  SpO2 98 % 02/08/24 10:31  Vitals shown include unfiled device data.  Last Pain:  Vitals:   02/08/24 0804  TempSrc: Temporal  PainSc: 0-No pain      Patients Stated Pain Goal: 1 (02/08/24 0804)  Complications: No notable events documented.

## 2024-02-08 NOTE — Anesthesia Postprocedure Evaluation (Signed)
 Anesthesia Post Note  Patient: Amy Sanders  Procedure(s) Performed: RADIOACTIVE SEED GUIDED BREAST BIOPSY (Left: Breast)     Patient location during evaluation: PACU Anesthesia Type: General Level of consciousness: awake and alert Pain management: pain level controlled Vital Signs Assessment: post-procedure vital signs reviewed and stable Respiratory status: spontaneous breathing, nonlabored ventilation and respiratory function stable Cardiovascular status: blood pressure returned to baseline and stable Postop Assessment: no apparent nausea or vomiting Anesthetic complications: no   No notable events documented.  Last Vitals:  Vitals:   02/08/24 1045 02/08/24 1118  BP: 122/74 120/76  Pulse: 69 65  Resp: (!) 8 16  Temp:  (!) 36.3 C  SpO2: 96% 96%    Last Pain:  Vitals:   02/08/24 1118  TempSrc: Temporal  PainSc: 0-No pain                 Butler Levander Pinal

## 2024-02-08 NOTE — Discharge Instructions (Signed)
 [facility] Office Phone Number [phone]  BREAST BIOPSY/ PARTIAL MASTECTOMY: POST OP INSTRUCTIONS  Always review your discharge instruction sheet given to you by the facility where your surgery was performed.  IF YOU HAVE DISABILITY OR FAMILY LEAVE FORMS, YOU MUST BRING THEM TO THE OFFICE FOR PROCESSING.  DO NOT GIVE THEM TO YOUR DOCTOR.  A prescription for pain medication may be given to you upon discharge.  Take your pain medication as prescribed, if needed.  If narcotic pain medicine is not needed, then you may take acetaminophen  (Tylenol ) or ibuprofen (Advil) as needed. Take your usually prescribed medications unless otherwise directed If you need a refill on your pain medication, please contact your pharmacy.  They will contact our office to request authorization.  Prescriptions will not be filled after 5pm or on week-ends. You should eat very light the first 24 hours after surgery, such as soup, crackers, pudding, etc.  Resume your normal diet the day after surgery. Most patients will experience some swelling and bruising in the breast.  Ice packs and a good support bra will help.  Swelling and bruising can take several days to resolve.  It is common to experience some constipation if taking pain medication after surgery.  Increasing fluid intake and taking a stool softener will usually help or prevent this problem from occurring.  A mild laxative (Milk of Magnesia or Miralax) should be taken according to package directions if there are no bowel movements after 48 hours. Unless discharge instructions indicate otherwise, you may remove your bandages 24-48 hours after surgery, and you may shower at that time.  You may have steri-strips (small skin tapes) in place directly over the incision.  These strips should be left on the skin for 7-10 days.  If your surgeon used skin glue on the incision, you may shower in 24 hours.  The glue will flake off over the next 2-3 weeks.  Any  sutures or staples will be removed at the office during your follow-up visit. ACTIVITIES:  You may resume regular daily activities (gradually increasing) beginning the next day.  Wearing a good support bra or sports bra minimizes pain and swelling.  You may have sexual intercourse when it is comfortable. You may drive when you no longer are taking prescription pain medication, you can comfortably wear a seatbelt, and you can safely maneuver your car and apply brakes. RETURN TO WORK:  ______________________________________________________________________________________ Amy Sanders should see your doctor in the office for a follow-up appointment approximately two weeks after your surgery.  Your doctor's nurse will typically make your follow-up appointment when she calls you with your pathology report.  Expect your pathology report 2-3 business days after your surgery.  You may call to check if you do not hear from us  after three days. OTHER INSTRUCTIONS: YOU MAY SHOWER STARTING TOMORROW ICE PACK, TYLENOL  ALSO FOR PAIN NO VIGOROUS ACTIVITY FOR 1 WEEK_______________________________________________________________________________________________ _____________________________________________________________________________________________________________________________________ _____________________________________________________________________________________________________________________________________ _____________________________________________________________________________________________________________________________________  WHEN TO CALL YOUR DOCTOR: Fever over 101.0 Nausea and/or vomiting. Extreme swelling or bruising. Continued bleeding from incision. Increased pain, redness, or drainage from the incision.  The clinic staff is available to answer your questions during regular business hours.  Please don't hesitate to call and ask to speak to one of the nurses for clinical concerns.  If you  have a medical emergency, go to the nearest emergency room or call 911.  A surgeon from [facility] is always on call at the hospital.  For further questions, please visit [email or web address]

## 2024-02-09 ENCOUNTER — Encounter (HOSPITAL_BASED_OUTPATIENT_CLINIC_OR_DEPARTMENT_OTHER): Payer: Self-pay | Admitting: Surgery

## 2024-02-10 LAB — SURGICAL PATHOLOGY
